# Patient Record
Sex: Male | Born: 1970 | Marital: Single | Smoking: Never smoker
Health system: Northeastern US, Community
[De-identification: ages and names within clinical notes are randomized; demographics above are authoritative.]

## PROBLEM LIST (undated history)

## (undated) DIAGNOSIS — R569 Unspecified convulsions: Secondary | ICD-10-CM

## (undated) HISTORY — DX: Unspecified convulsions: R56.9

## (undated) HISTORY — PX: NO SIGNIFICANT SURGICAL HISTORY: 1000005

---

## 2007-10-21 ENCOUNTER — Emergency Department (HOSPITAL_BASED_OUTPATIENT_CLINIC_OR_DEPARTMENT_OTHER)
Admission: RE | Admit: 2007-10-21 | Disposition: A | Discharge status: Home / Self Care | Payer: Self-pay | Source: Emergency Department | Attending: Emergency Medicine | Admitting: Emergency Medicine

## 2007-10-21 ENCOUNTER — Encounter (HOSPITAL_BASED_OUTPATIENT_CLINIC_OR_DEPARTMENT_OTHER): Payer: Self-pay

## 2007-10-21 MED ORDER — IBUPROFEN 600 MG PO TABS
ORAL_TABLET | ORAL | Status: AC
Start: 2007-10-21 — End: 2007-10-28
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
## 2007-10-23 ENCOUNTER — Ambulatory Visit (HOSPITAL_BASED_OUTPATIENT_CLINIC_OR_DEPARTMENT_OTHER): Payer: Self-pay | Admitting: Ent-Otolaryngology

## 2007-10-31 LAB — EMERGENCY ROOM NOTE

## 2008-06-13 ENCOUNTER — Encounter (HOSPITAL_BASED_OUTPATIENT_CLINIC_OR_DEPARTMENT_OTHER): Payer: Self-pay

## 2008-06-13 ENCOUNTER — Emergency Department (HOSPITAL_BASED_OUTPATIENT_CLINIC_OR_DEPARTMENT_OTHER)
Admission: RE | Admit: 2008-06-13 | Disposition: A | Discharge status: Home / Self Care | Payer: Self-pay | Source: Emergency Department | Attending: Emergency Medicine | Admitting: Emergency Medicine

## 2008-06-13 MED ORDER — IBUPROFEN 800 MG PO TABS
ORAL_TABLET | ORAL | Status: AC
Start: 2008-06-13 — End: 2008-07-13

## 2008-06-13 MED ORDER — CIPRO 500 MG PO TABS
ORAL_TABLET | ORAL | Status: AC
Start: 2008-06-13 — End: 2008-06-23

## 2008-06-13 NOTE — ED Notes (Signed)
Pt states he has testicular pain for 4 days, denies dysuria. Pt states he has diarrhea for 1 day. Pt denies abd pain.

## 2008-06-13 NOTE — Discharge Instructions (Signed)
Take medications as prescribed.     Return if not better in 2 days for recheck.

## 2008-06-17 LAB — EMERGENCY ROOM NOTE

## 2008-12-15 ENCOUNTER — Emergency Department (HOSPITAL_BASED_OUTPATIENT_CLINIC_OR_DEPARTMENT_OTHER)
Admission: RE | Admit: 2008-12-15 | Disposition: A | Discharge status: Home / Self Care | Payer: Self-pay | Source: Emergency Department

## 2008-12-15 ENCOUNTER — Encounter (HOSPITAL_BASED_OUTPATIENT_CLINIC_OR_DEPARTMENT_OTHER): Payer: Self-pay

## 2008-12-15 LAB — EMERGENCY ROOM NOTE

## 2008-12-15 NOTE — ED Notes (Signed)
Pt  C/o  Nausea for 15 days,    Also   Wants  STD  Testing,  Had intercourse 2 months ago  Unprotected  No symptoms of  Pain or discharge from penis.  No problems with urination.    Pt also requesting  Blood work done for  DM,  Chol  HIV  AIDs,  Per pt, " my blood in my  veins feels different"

## 2008-12-16 LAB — CHLAMYDIA GC NAAT
GENPROBE CHLAMYDIA: NEGATIVE
GENPROBE GC: NEGATIVE

## 2008-12-16 LAB — TREPONEMA PALLIDUM AB IGG: TREPONEMA PALLIDUM AB IgG: NONREACTIVE

## 2008-12-17 ENCOUNTER — Ambulatory Visit (HOSPITAL_BASED_OUTPATIENT_CLINIC_OR_DEPARTMENT_OTHER): Payer: PRIVATE HEALTH INSURANCE

## 2008-12-17 ENCOUNTER — Encounter (HOSPITAL_BASED_OUTPATIENT_CLINIC_OR_DEPARTMENT_OTHER): Payer: Self-pay

## 2008-12-17 DIAGNOSIS — Z717 Human immunodeficiency virus [HIV] counseling: Secondary | ICD-10-CM

## 2008-12-17 LAB — ICTR HIV POC RAPID
HIV 1 & 2 BY ORAQUICK: NEGATIVE
ONBOARD CONTROL PRESENT?: NEGATIVE

## 2008-12-17 NOTE — Progress Notes (Signed)
--  Subjective: History:    No chief complaint on file.        Current outpatient prescriptions:  IBUPROFEN 800 MG OR TABS Take 1 tab by mouth every 8 hours as needed for pain Disp: 20 Rfl: 0         Allergies: Review of patient's allergies indicates no known allergies.    Specific reason for testing: routine   -Disc:  Transmission (Blood, Semen, Vaginal Fluid and Breastmilk), Risky Behaviors, Non-Risks/Myths,  Seroconversion Period , Result Waiting Time, Possible Results, Feelings Surrounding Possible Results, Plans for Informing of Results, Support Structures and Hep C Risk.     Risk Assessment Completed:  y  Piercing/Tattoo: no  Known Partner Risks: y  IV Drug Use: no    Sexual History:    Current Partner:    # of partners (Last Year): 02    Sexual Partners: Both    Sexual Practices: Vaginal, Anal, Oral    Condom Use: Always    Condom Breakage:    Condom Instruction Given:    Risk Reduction Plans: y    --Objective:    HX of STDs: (dx/tx)  Tested Before: Yes:  HIV, Dates 10/2007    DV Assessment Completed: y    Hx of DV/IP abuse: n    Hx of Sexual Assault: n    Sexual Coercion: n    Last Poss. Exposure: 10/2008    Protection used: y    Seroconversion Period Explained:    Testing Options Chosen  Readiness to test:   Still want to test?   yes    Patient chose to be tested for: HIV    Test sent to: State lab    Type of test: Blood    --Assessment: HIV, HCV, and STI Education and Risk Reduction      --Plan: Education Counseling and Referrals      Consent for treatment  Confidentiality  Handouts given      Referrals:   Support Person Plan:   Primary Care:   Support Services:   Re-testing Scheduled:     Follow Up Plan:

## 2008-12-31 ENCOUNTER — Ambulatory Visit (HOSPITAL_BASED_OUTPATIENT_CLINIC_OR_DEPARTMENT_OTHER): Payer: PRIVATE HEALTH INSURANCE

## 2009-06-29 ENCOUNTER — Emergency Department (HOSPITAL_BASED_OUTPATIENT_CLINIC_OR_DEPARTMENT_OTHER)
Admission: RE | Admit: 2009-06-29 | Disposition: A | Discharge status: Home / Self Care | Payer: Self-pay | Source: Emergency Department | Attending: Emergency Medicine | Admitting: Emergency Medicine

## 2009-06-29 ENCOUNTER — Encounter (HOSPITAL_BASED_OUTPATIENT_CLINIC_OR_DEPARTMENT_OTHER): Payer: Self-pay

## 2009-06-29 LAB — EMERGENCY ROOM NOTE

## 2009-06-29 NOTE — ED Notes (Signed)
Injured  right  Hand yesterday  Abrasion on knuckles  Hand is swollen  No meds today    Pt is able to move fingers,  Circulation is good, positive

## 2009-06-29 NOTE — Discharge Instructions (Signed)
[place] [facility]    Gelo    [facility] -- 11:30 AM -- [employer] in [place]

## 2009-06-30 ENCOUNTER — Ambulatory Visit (HOSPITAL_BASED_OUTPATIENT_CLINIC_OR_DEPARTMENT_OTHER): Payer: Self-pay | Admitting: Hand Surgery

## 2009-06-30 ENCOUNTER — Encounter (HOSPITAL_BASED_OUTPATIENT_CLINIC_OR_DEPARTMENT_OTHER): Payer: Self-pay | Admitting: Hand Surgery

## 2009-06-30 DIAGNOSIS — S62309A Unspecified fracture of unspecified metacarpal bone, initial encounter for closed fracture: Secondary | ICD-10-CM

## 2009-06-30 NOTE — Progress Notes (Signed)
This office note has been dictated. Account number [id number]

## 2009-07-01 ENCOUNTER — Telehealth (HOSPITAL_BASED_OUTPATIENT_CLINIC_OR_DEPARTMENT_OTHER): Payer: Self-pay | Admitting: Hand Surgery

## 2009-07-01 ENCOUNTER — Ambulatory Visit (HOSPITAL_BASED_OUTPATIENT_CLINIC_OR_DEPARTMENT_OTHER): Payer: Self-pay | Admitting: Hand Surgery

## 2009-07-01 DIAGNOSIS — S62309A Unspecified fracture of unspecified metacarpal bone, initial encounter for closed fracture: Secondary | ICD-10-CM

## 2009-07-01 LAB — BLOOD SUGAR FINGERSTICK (POINT OF CARE): FINGERSTICK GLUCOSE: 89 mg/dl (ref 74–160)

## 2009-07-01 MED ORDER — VICODIN 5-500 MG PO TABS
ORAL_TABLET | ORAL | Status: DC
Start: 2009-07-01 — End: 2013-11-19

## 2009-07-01 MED ORDER — OXYCODONE-ACETAMINOPHEN 5-325 MG PO TABS
ORAL_TABLET | ORAL | Status: AC
Start: 2009-07-01 — End: 2009-08-25

## 2009-07-07 ENCOUNTER — Ambulatory Visit (HOSPITAL_BASED_OUTPATIENT_CLINIC_OR_DEPARTMENT_OTHER): Payer: Self-pay

## 2009-07-07 DIAGNOSIS — S62309A Unspecified fracture of unspecified metacarpal bone, initial encounter for closed fracture: Secondary | ICD-10-CM

## 2009-07-07 NOTE — Progress Notes (Signed)
[name] Kitchen  BASE EVALUATION - UPPER EXTREMITY    AGE: 39 year old  SEX: male  CHILDREN?: Yes  (2 children in [place])     RELATIONSHIP: Partnered ETHNICITY: [place] INTERPRETER?:  Yes  ([place] interpreter via phone)     LEISURE/HOBBIES: "meet with my friends socially, go for walks."     REFERRING PROVIDER: Debra A. Olam Idler, MD  [address]  [place], [place] [address]     DIAGNOSIS/REASON FOR REFERRAL: Percutaneous Pinning of right small finger metacarpal secondary to comminuted fracture base of right small finger metacarpal        Hx OF PRESENT ILLNESS: Patient reported on 06/28/09 he was at home standing on a chair and placing sheet rock up when he fell and landed on his right upper extremity.  Patient reported he rested his hand that night and the swelling and pain in his fingers increased into the next day, so he decided to go to the ER at [facility]. Xray showed comminuted base of the small finger metacarpal fracture and was placed in a splint.  Patient was seen in Orthopedics by Dr. Olam Idler on 06/30/09 and was indicated for surgery.  Patient underwent percutaneous pinning of right small finger metacarpal on 07/01/09 by Dr. Olam Idler. Patient arrived to appointment with post-operative dressing in place on right upper extremity: clean, dry and intact     MEDICAL Hx: See online medical chart in EPIC   SURGICAL Hx: Refer to online medical chart in EPIC   SOCIAL HISTORY/HOME SITUATION: Patient lives w/ friend(s).  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   Patient has been unable to work since this injury.   DOES PATIENT FEEL SAFE AT HOME: Yes LEARNS BEST: Verb.; demo   PAIN AT WORST: 2/10  "when I hang it down or if I try to move it."   PAIN AT BEST: 0/10 AVERAGE: 0/10   PAIN DESCRIPTION: Intermittent, stabbing pain   LOCATION OF PAIN (Use Images activity for visual diagram):  Ulnar side of right hand, over pins   SKIN INTEGRITY/COLOR: Patient reported noticing a slight rash volar surface of his right  elbow yesterday.  Upon removal of post-operative dressing and bathing of right forearm, volar and medial surfaces with slight rash.  Middle pin: slight pink at base; no tenderness upon palpation of skin at base of middle pin. Superior and inferior pins with no erythema/pink,  No drainage at base of all three pins.     SENSATION: WNL? No.  Patient reports feeling intermittent numbness on the dorsum of right hand     SUTURES: OTHER: None present MEASUREMENTS: N/A     DOMINANCE: right     EDEMA: Figure of 8: right 45.3cm; left 43.4cm       Please Note: Only populated fields were assessed by provider, fields left blank were not assessed.    GIRTH RIGHT LEFT GIRTH RIGHT LEFT GIRTH RIGHT LEFT   WRIST 16.8cm 16.6cm MP CIRCUMF. 22.6cm  20.9cm ELBOWS       SPECIAL TEST RIGHT LEFT SPECIAL TEST RIGHT LEFT SPECIAL TEST RIGHT LEFT   FINKELSTEINS   PHALENS   LONG FINGER     TINELS TEST             STRENGTH AND ROM:  RIGHT RIGHT LEFT LEFT COMMENTS    ROM MMT ROM MMT    SHOULD FLEX 0-180         EXT 0-50         ABDUCTION 0-160  INTERNAL ROTATION 0-70         EXTERNAL ROTATION 0-90        ELBOW FLEX/EXT 0-145        FOREAR PRONATION 0-85         SUPINATION 0-85        WRIST FLEX 0-70 0-30  WNL      EXT 0-70 0-45  WNL      ULNAR DEVIATION 0-30 0-20  WNL      RADIAL DEVIATION 0-20 WNL  WNL       ROM:  RIGHT RIGHT LEFT LEFT COMMENTS    ROM MMT AROM MMT    THUMB CM FLEX 0-15 WNL  WNL      CM EXT 0-20 WNL  WNL      MP FLEX/EXT 0-50 WNL  WNL      IP FLEX/EXT 0-80 WNL  WNL      ABDUCTION 0-70 WNL  WNL      OPPOSITION (CM) full  WNL     D2 MP FLEX 0-90 0-50  WNL      PIP FLEX/EXT 0-100 0-90  WNL      DIP FLEX/EXT 0-90 0-55  WNL     D3 MP FLEX 0-90 0-45  WNL      PIP FLEX/EXT 0-100 0-85  WNL      DIP FLEX/EXT 0-90 0-55  WNL     D4 MP FLEX 0-90 0-40  WNL      PIP FLEX/EXT 0-100 0-85  WNL      DIP FLEX/EXT 0-90 0-55  WNL     D5 MP FLEX 0-90 0-20  WNL      PIP FLEX/EXT 0-100 0-85  WNL      DIP FLEX/EXT 0-90 0-48  WNL     GRIP GRIP      Strength testing deferred at this time.   PINCH LATERAL         TRIPOD         TIP                   FUNCTIONAL STATUS  COMMENTS     FEEDING Impaired Patient reports cutting food with his left hand; if he uses his right hand, there is increased discomfort   PERSONAL CARE Impaired Increased time.  Compensates with right hand   DRESSING Impaired Increased time.  Compensates with right hand   LIFTING IMpaired Using left hand to lift items.  Patient reports he has not been shoveling snow since the injury   HOUSEHOLD CHORES Impaired Able to complete all ADL with increased time     Comments: Ulnar gutter splint custom fabricated at end of session to include wrist, RF and SF.  Home exercise program established and included right wrist AROM and tendon gliding exercises to be completed 8x/day, 10 reps.  Patient instructed in splint wear, care and precautions and verbalized good understanding.    Occupational Therapy Plan of Care    MD: Michela Pitcher, MD  Referring Provider: Michela Pitcher, MD  Diagnosis: CRPP 5th metacarpal right hand secondary to comminuted fracture of 5th metacarpal    Assessment/Objective Findings: Patient is a 39 year old male who is status post percutaneous pinning of the 5th metacarpal (07/01/09) of his right hand secondary to comminuted fracture at base of 5th metacarpal (06/29/09).  Patient presents with impaired skin integrity, edema, pain, decreased AROM and strength of right hand limiting his independence with self care, IADL and ability to work.  Patient will benefit from skilled OT to address the rehabilitation goals outlined below.    Rehabilitation Goals  Patient to be Independent with Home Exercise Program.  Duration: 2 weeks  Average pain will be reduced from a 2/10 to a 0/10. Duration: 6 weeks  AROM right small finger will increase to WNL. 6 weeks  Grip strength of right hand will increase to 20% greater than that of left hand. 6 weeks  Patient will verbalize and demonstrate excellent carryover  recommended splint wear and care. 6 weeks    Long Term Goal: "Get back to work."    Treatment Plan:   ** Stretching/ROM Exercise  ** Therapeutic Exercise  ** Home Exercise Program  ** Soft Tissue Mobilization  ** Ultrasound  ** Hot/Cold Rx  ** Functional Activities  ** Patient Education  ** Splinting  ** Pin Care    Recommend Occupational Therapy be continued 2 times per week for 6 weeks.  The rehabilitation potential for this patient is good    Patient is agreeable to treatment plan and is aware of attendance policy.    Bennie Dallas MS,OTR/L

## 2009-07-12 ENCOUNTER — Ambulatory Visit (HOSPITAL_BASED_OUTPATIENT_CLINIC_OR_DEPARTMENT_OTHER): Payer: Self-pay

## 2009-07-12 DIAGNOSIS — S62309A Unspecified fracture of unspecified metacarpal bone, initial encounter for closed fracture: Secondary | ICD-10-CM

## 2009-07-14 ENCOUNTER — Ambulatory Visit (HOSPITAL_BASED_OUTPATIENT_CLINIC_OR_DEPARTMENT_OTHER): Payer: Self-pay | Admitting: Rehabilitative and Restorative Service Providers"

## 2009-07-14 ENCOUNTER — Ambulatory Visit (HOSPITAL_BASED_OUTPATIENT_CLINIC_OR_DEPARTMENT_OTHER): Payer: Self-pay | Admitting: Hand Surgery

## 2009-07-14 DIAGNOSIS — S62309A Unspecified fracture of unspecified metacarpal bone, initial encounter for closed fracture: Secondary | ICD-10-CM

## 2009-07-14 MED ORDER — CEPHALEXIN 500 MG PO CAPS
500.0000 mg | ORAL_CAPSULE | Freq: Three times a day (TID) | ORAL | Status: AC
Start: 2009-07-14 — End: 2009-07-24

## 2009-07-14 NOTE — Progress Notes (Signed)
This office note has been dictated. Account number [id number]

## 2009-07-19 ENCOUNTER — Ambulatory Visit (HOSPITAL_BASED_OUTPATIENT_CLINIC_OR_DEPARTMENT_OTHER): Payer: Self-pay

## 2009-07-20 LAB — OPERATIVE REPORT

## 2009-07-26 ENCOUNTER — Ambulatory Visit (HOSPITAL_BASED_OUTPATIENT_CLINIC_OR_DEPARTMENT_OTHER): Payer: Self-pay

## 2009-07-28 ENCOUNTER — Ambulatory Visit (HOSPITAL_BASED_OUTPATIENT_CLINIC_OR_DEPARTMENT_OTHER): Payer: Self-pay

## 2009-08-02 ENCOUNTER — Ambulatory Visit (HOSPITAL_BASED_OUTPATIENT_CLINIC_OR_DEPARTMENT_OTHER): Payer: Self-pay

## 2009-08-02 NOTE — Patient Instructions (Addendum)
Rehabilitation Treatment Flowsheet    Precautions: None noted    Date: 07/07/09         Initials: LVC         Visit #: 1         POC Due Date: 08/18/09         Time:         HEP Splint; keep right hand dry' TGE's         Treatment(s)            Post-operative dressing removal right UE x           Evaluation x           Splinting UG splint custom fabricated; patient instructed to wear splint at all times except ex.           Pin care x           Joint blocking right SF            Tendon gliding exercise right hand

## 2009-08-02 NOTE — Progress Notes (Signed)
07/12/09  S: "I called and my insurance said today was all right."  O: Pins: no erythema; no drainage; 3 pins cleaned and dressed with xeroform and dry/sterile dressings.  AROM: straight fist WNL; table top WNL; right wrist extension 47 degrees; right wrist flexion 68 degrees; tip of right MF to [facility] is 2cm.  A:  Patient demonstrating excellent carryover of splint wear and HEP (AROM wrist, TGE) established at time of initial evaluation.  P:  Continue with OT per current plan of care.

## 2009-08-02 NOTE — Patient Instructions (Signed)
Rehabilitation Treatment Flowsheet    Precautions: None noted    Date: 07/07/09 07/12/09        Initials: LVC LVC        Visit #: 1 2        POC Due Date: 08/18/09         Time:         HEP Splint; keep right hand dry, TGE's Cont.        Treatment(s)            Post-operative dressing removal right UE x           Evaluation x           Splinting UG splint custom fabricated; patient instructed to wear splint at all times except ex. Cont.          Pin care x x          Joint blocking right SF  x          Tendon gliding exercises right hand  x

## 2009-08-04 ENCOUNTER — Ambulatory Visit (HOSPITAL_BASED_OUTPATIENT_CLINIC_OR_DEPARTMENT_OTHER): Payer: PRIVATE HEALTH INSURANCE | Admitting: Hand Surgery

## 2009-08-04 ENCOUNTER — Ambulatory Visit (HOSPITAL_BASED_OUTPATIENT_CLINIC_OR_DEPARTMENT_OTHER): Payer: PRIVATE HEALTH INSURANCE

## 2009-08-04 DIAGNOSIS — S62309A Unspecified fracture of unspecified metacarpal bone, initial encounter for closed fracture: Secondary | ICD-10-CM

## 2009-08-04 NOTE — Progress Notes (Signed)
This office note has been dictated. Account number [id number]

## 2009-08-09 ENCOUNTER — Ambulatory Visit (HOSPITAL_BASED_OUTPATIENT_CLINIC_OR_DEPARTMENT_OTHER): Payer: PRIVATE HEALTH INSURANCE

## 2009-08-11 ENCOUNTER — Ambulatory Visit (HOSPITAL_BASED_OUTPATIENT_CLINIC_OR_DEPARTMENT_OTHER): Payer: PRIVATE HEALTH INSURANCE

## 2009-08-15 LAB — ORTHOPEDIC OFFICE NOTE

## 2009-08-21 ENCOUNTER — Encounter (HOSPITAL_BASED_OUTPATIENT_CLINIC_OR_DEPARTMENT_OTHER): Payer: Self-pay

## 2009-08-21 ENCOUNTER — Emergency Department (HOSPITAL_BASED_OUTPATIENT_CLINIC_OR_DEPARTMENT_OTHER): Admission: RE | Admit: 2009-08-21 | Payer: Self-pay

## 2009-08-21 ENCOUNTER — Emergency Department (HOSPITAL_BASED_OUTPATIENT_CLINIC_OR_DEPARTMENT_OTHER)
Admission: RE | Admit: 2009-08-21 | Disposition: A | Discharge status: Home / Self Care | Payer: Self-pay | Source: Emergency Department | Attending: Emergency Medicine | Admitting: Emergency Medicine

## 2009-08-21 MED ORDER — IBUPROFEN 800 MG PO TABS
800.0000 mg | ORAL_TABLET | Freq: Three times a day (TID) | ORAL | Status: AC | PRN
Start: 2009-08-21 — End: 2009-09-20

## 2009-08-21 MED ORDER — CYCLOBENZAPRINE HCL 10 MG PO TABS
10.00 mg | ORAL_TABLET | Freq: Three times a day (TID) | ORAL | Status: AC | PRN
Start: 2009-08-21 — End: 2009-09-20

## 2009-08-21 MED ORDER — OXYCODONE-ACETAMINOPHEN 5-325 MG PO TABS
1.0000 | ORAL_TABLET | ORAL | Status: DC
Start: 2009-08-21 — End: 2013-11-19

## 2009-08-21 NOTE — Discharge Instructions (Signed)
Avoid activities that worsen your discomfort.  You may apply ice packs to your painful area intermittently for 2 days, then use warm compresses.  Take your medications as prescribed; they may cause sedation.  Contact the [facility] for on going medical care.  Return if your symptoms worsen or new symptoms develop.

## 2009-08-21 NOTE — ED Notes (Signed)
Pt arrives ambulatory with CC of low back pain - worse on right side x's 7-8 days.   Denies any trauma.  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

## 2009-08-22 LAB — EMERGENCY ROOM NOTE

## 2009-08-24 NOTE — Patient Instructions (Signed)
Rehabilitation Treatment Flowsheet    Precautions: None noted    Date: 07/07/09 07/12/09 07/14/09       Initials: LVC LVC DB       Visit #: 1 2 3        POC Due Date: 08/18/09         Time:  30 min       HEP Splint; keep right hand dry, TGE's Cont.        Treatment(s)            Post-operative dressing removal right UE x           Evaluation x           Splinting UG splint custom fabricated; patient instructed to wear splint at all times except ex. Cont. cont         Pin care x x x         Joint blocking right SF  x x         Tendon gliding exercises right hand  x x

## 2009-08-24 NOTE — Progress Notes (Signed)
07/14/09  S: "I am doing better."  O: See Flowsheet.  A: Pins clean and dry. Patient progressing toward goals.  P: Continue with OT per POC.    07/12/09  S: "I called and my insurance said today was all right."  O: Pins: no erythema; no drainage; 3 pins cleaned and dressed with xeroform and dry/sterile dressings.  AROM: straight fist WNL; table top WNL; right wrist extension 47 degrees; right wrist flexion 68 degrees; tip of right MF to [facility] is 2cm.  A:  Patient demonstrating excellent carryover of splint wear and HEP (AROM wrist, TGE) established at time of initial evaluation.  P:  Continue with OT per current plan of care.

## 2009-08-25 NOTE — Patient Instructions (Addendum)
Rehabilitation Treatment Flowsheet    Precautions: None noted    Date: 07/07/09 07/12/09 07/14/09 08/04/09      Initials: LVC LVC DB LVC      Visit #: 1 2 3 4       POC Due Date: 08/18/09         Time:  30 min      HEP Splint; keep right hand dry, TGE's Cont.        Treatment(s)            Post-operative dressing removal right UE x           Evaluation x           Splinting UG splint custom fabricated; patient instructed to wear splint at all times except ex. Cont. cont New splint fabricated        Pin care x x x Pins removed at appt. today        Joint blocking right SF  x x WNL        Tendon gliding exercises right hand  x x WNL

## 2009-08-25 NOTE — Progress Notes (Signed)
07/14/09  S: "I am doing better."  O: See Flowsheet.  A: Pins clean and dry. Patient progressing toward goals.  P: Continue with OT per POC.    07/12/09  S: "I called and my insurance said today was all right."  O: Pins: no erythema; no drainage; 3 pins cleaned and dressed with xeroform and dry/sterile dressings.  AROM: straight fist WNL; table top WNL; right wrist extension 47 degrees; right wrist flexion 68 degrees; tip of right MF to [facility] is 2cm.  A:  Patient demonstrating excellent carryover of splint wear and HEP (AROM wrist, TGE) established at time of initial evaluation.  P:  Continue with OT per current plan of care.    08/04/09  S: "I saw Dr. Olam Idler today and she said I am recovering well."  O: Skin: pins removed; no erythema; no drainage.  AROM: WNL all joints in all digits; full tendon excursion.  Splint: new splint fabricated for right UE, as current splint is too big now that pins are removed.  Patient reports he has been working, with the splint donned at all times, and has not been using his R UE to lift anything.  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    A:  Patient has demonstrated excellent gains since last visit.    P:  Continue with OT per current plan of care.

## 2009-09-01 ENCOUNTER — Ambulatory Visit (HOSPITAL_BASED_OUTPATIENT_CLINIC_OR_DEPARTMENT_OTHER): Payer: PRIVATE HEALTH INSURANCE

## 2009-09-02 LAB — ORTHOPEDIC OFFICE NOTE

## 2009-09-06 LAB — EMERGENCY ROOM NOTE

## 2010-03-13 ENCOUNTER — Emergency Department (HOSPITAL_BASED_OUTPATIENT_CLINIC_OR_DEPARTMENT_OTHER)
Admission: RE | Admit: 2010-03-13 | Disposition: A | Discharge status: Home / Self Care | Payer: Self-pay | Source: Emergency Department | Attending: Emergency Medicine | Admitting: Emergency Medicine

## 2010-03-13 ENCOUNTER — Encounter (HOSPITAL_BASED_OUTPATIENT_CLINIC_OR_DEPARTMENT_OTHER): Payer: Self-pay | Admitting: Geri Psychiatry (PRV Practice 16)

## 2010-03-13 MED ORDER — HYDROXYZINE HCL 25 MG PO TABS
25.00 mg | ORAL_TABLET | Freq: Four times a day (QID) | ORAL | Status: AC | PRN
Start: 2010-03-13 — End: 2010-03-23

## 2010-03-13 MED ORDER — HYDROXYZINE HCL 25 MG PO TABS
25.0000 mg | ORAL_TABLET | Freq: Once | ORAL | Status: DC
Start: 2010-03-13 — End: 2010-03-13
  Filled 2010-03-13: qty 1

## 2010-03-13 MED ORDER — PREDNISONE 10 MG PO TABS
ORAL_TABLET | ORAL | Status: AC
Start: 2010-03-14 — End: 2010-03-24

## 2010-03-13 MED ORDER — PREDNISONE 20 MG PO TABS
60.0000 mg | ORAL_TABLET | Freq: Once | ORAL | Status: DC
Start: 2010-03-13 — End: 2010-03-13
  Filled 2010-03-13: qty 3

## 2010-03-13 NOTE — Discharge Instructions (Signed)
Take medications as prescribed.     Follow up if not better next week.     If you need or would like a PCP, please call [facility] (located in the Medical Building attached to the hospital on [address]) at [phone] for appointment.

## 2010-03-13 NOTE — ED Notes (Signed)
39 y/o male presents with with swelling and rash on cheeks for 1 week.  Prior to this he complained of itchy eyes 3 weeks ago which resolved.

## 2010-03-15 LAB — EMERGENCY ROOM NOTE

## 2010-03-19 ENCOUNTER — Emergency Department (HOSPITAL_BASED_OUTPATIENT_CLINIC_OR_DEPARTMENT_OTHER)
Admission: RE | Admit: 2010-03-19 | Disposition: A | Discharge status: Home / Self Care | Payer: Self-pay | Source: Emergency Department | Attending: Emergency Medicine | Admitting: Emergency Medicine

## 2010-03-19 MED ORDER — IBUPROFEN 600 MG PO TABS
600.0000 mg | ORAL_TABLET | Freq: Three times a day (TID) | ORAL | Status: DC | PRN
Start: 2010-03-19 — End: 2013-11-16

## 2010-03-19 MED ORDER — IBUPROFEN 600 MG PO TABS
ORAL_TABLET | ORAL | Status: DC
Start: 2010-03-19 — End: 2010-03-19
  Filled 2010-03-19: qty 1

## 2010-03-19 MED ORDER — IBUPROFEN 600 MG PO TABS
600.00 mg | ORAL_TABLET | Freq: Once | ORAL | Status: AC
Start: 2010-03-19 — End: 2010-03-19
  Administered 2010-03-19: 600 mg via ORAL

## 2010-03-19 NOTE — Discharge Instructions (Signed)
Motrin as needed follow up pcp if symptoms do not improve or other problems developDor Msculo-Esqueltica  (Musculo-Skeletal Pain)     Voc procurou o mdico por sentir dores musculares e sseas. Elas podem aparecer em qualquer parte do corpo e freqentemente no h causa definida nem uma razo que justifique sua presena. Se os resultados de exames de laboratrio (sangue ou urina) foram normais, assim como raios X ou outros estudos, o mdico pode trat-lo sem conhecer a Engineering geologist. Estas dores podem ser causadas por um vrus. O incmodo tambm pode vir de esforo excessivo, como praticar ginstica rdua quando seu corpo no est em forma para enfrentar as [employer]. As dores sseas tambm so causadas por mudanas no tempo, uma vez que os ossos so sensveis a mudanas na presso atmosfrica.      Se voc sofre dores sem motivo aparente,  importante seguir as instrues do mdico. Se a dor vier a piorar ou no ceder, pode ser necessrio repetir exames ou fazer exames adicionais e aprofundar na busca de uma causa possvel.     INSTRUES PARA TRATAMENTO DOMICILIAR   S tome no balcao ou medicinas de receita para dor, incmodo, ou febre como dirigido por seu mdico.   Para manter sua privacidade, os [employer] testes no podem ser informados por telefone. No deixe de obter os resultados de [employer]. Pergunte como proceder para conseguir [employer] se voc no tiver sido informado.  sua responsabilidade obter os resultados.   Prossiga suas atividades normais, a menos que causem [employer]. Quando a dor diminui,  essencial voltar lentamente s atividades normais. Volte s atividades ou exerccios lentamente no comeo e v gradualmente incrementando a intensidade e durao. Durante perodos de dor aguda,  aconselhvel repousar na cama. Deite ou sente-se em qualquer posio que traga conforto.    Usar gelo para condies penetrantes (repentinas) pode ser eficiente. Use um saco plstico  grande contendo o gelo, envolta em uma [place]. Isto pode aliviar a dor.   Procure o mdico se o problema continuar. Ele pode ajudar ou indicar exerccios ou fisioterapia, se necessrio.      Se tomar a medicao receitada para sua condio, no dirija, opere maquinrio ou ferramentas de alta potncia, nem assine documentos legais durante 24 horas. No tome lcool, soporficos ou outros medicamentos que possam afetar o tratamento.      PROCURE ASSISTNCIA MDICA IMEDIATAMENTE:   Se voc sentir dor que est piorando e no  aliviada com remdios.   Se voc sentir dor no peito associada com falta de ar, suor, nusea ou vmito.   Se sua dor estiver localizada no abdmen.   Se voc apresentar quaisquer novos sintomas que paream diferentes ou que o preocupem.     CERTIFIQUE-SE DE:   Compreende as instrues referentes  alta.    Ir monitorar sua condio.   Procurar assistncia mdica imediatamente, conforme indicado.     Document Released: 05/07/2005  Document Re-Released: 09/23/2008  [facility] Patient Information 2010 [place], [place].

## 2010-03-19 NOTE — ED Notes (Signed)
Pt c/o bilateral leg pain since this am. No known trauma. Also states he was bitten by an insect 2 days ago.

## 2010-03-22 LAB — EMERGENCY ROOM NOTE

## 2010-05-18 ENCOUNTER — Encounter (HOSPITAL_BASED_OUTPATIENT_CLINIC_OR_DEPARTMENT_OTHER): Payer: PRIVATE HEALTH INSURANCE

## 2010-05-18 ENCOUNTER — Encounter (HOSPITAL_BASED_OUTPATIENT_CLINIC_OR_DEPARTMENT_OTHER): Payer: Self-pay

## 2010-06-18 ENCOUNTER — Encounter (HOSPITAL_BASED_OUTPATIENT_CLINIC_OR_DEPARTMENT_OTHER): Payer: Self-pay

## 2010-06-18 ENCOUNTER — Emergency Department (HOSPITAL_BASED_OUTPATIENT_CLINIC_OR_DEPARTMENT_OTHER)
Admission: RE | Admit: 2010-06-18 | Disposition: A | Discharge status: Home / Self Care | Payer: Self-pay | Source: Emergency Department

## 2010-06-18 LAB — CBC WITH PLATELET
HEMATOCRIT: 43.8 % (ref 42.0–54.0)
HEMOGLOBIN: 14.9 g/dl (ref 14.0–18.0)
MEAN CORP HGB CONC: 34.1 g/dl (ref 32.0–36.0)
MEAN CORPUSCULAR HGB: 28.6 pg (ref 27.0–33.0)
MEAN CORPUSCULAR VOL: 84.1 fl (ref 80.0–100.0)
MEAN PLATELET VOLUME: 9.7 fl (ref 6.4–10.8)
PLATELET COUNT: 232 10*3/uL (ref 150–400)
RBC DISTRIBUTION WIDTH: 12.1 % (ref 11.5–14.3)
RED BLOOD CELL COUNT: 5.21 M/uL (ref 4.50–6.10)
WHITE BLOOD CELL COUNT: 11.5 10*3/uL — ABNORMAL HIGH (ref 4.0–10.8)

## 2010-06-18 MED ORDER — LIDOCAINE VISCOUS 2 % MT SOLN
5.00 mL | Freq: Once | OROMUCOSAL | Status: AC
Start: 2010-06-18 — End: 2010-06-18
  Administered 2010-06-18: 5 mL via OROMUCOSAL
  Filled 2010-06-18: qty 20

## 2010-06-18 MED ORDER — FAMOTIDINE 20 MG PO TABS
20.00 mg | ORAL_TABLET | Freq: Once | ORAL | Status: AC
Start: 2010-06-18 — End: 2010-06-18
  Administered 2010-06-18: 20 mg via ORAL
  Filled 2010-06-18: qty 1

## 2010-06-18 MED ORDER — ALUMINUM & MAGNESIUM HYDROXIDE 200-200 MG/5ML PO SUSP
30.00 mL | Freq: Once | ORAL | Status: AC
Start: 2010-06-18 — End: 2010-06-18
  Administered 2010-06-18: 30 mL via ORAL
  Filled 2010-06-18: qty 30

## 2010-06-18 NOTE — ED Notes (Signed)
Pt c/o upper abd pain x 3 hours.

## 2010-06-19 LAB — BASIC METABOLIC PANEL
ANION GAP: 9 mmol/L (ref 2–25)
BUN (UREA NITROGEN): 14 mg/dl (ref 6–20)
CALCIUM: 9 mg/dl (ref 8.6–10.3)
CARBON DIOXIDE: 26 mmol/L (ref 22–32)
CHLORIDE: 105 mmol/L (ref 101–111)
CREATININE: 0.8 mg/dl (ref 0.7–1.2)
ESTIMATED GLOMERULAR FILT RATE: >60 mL/min (ref >60)
Glucose Random: 112 mg/dl (ref 74–160)
POTASSIUM: 3.9 mmol/L (ref 3.5–5.1)
SODIUM: 140 mmol/L (ref 135–144)

## 2010-06-19 LAB — CHG HEPATIC FUNCTION PANEL
ALANINE AMINOTRANSFERASE: 83 IU/L — ABNORMAL HIGH (ref 10–40)
ALBUMIN: 4.1 g/dl (ref 3.4–4.8)
ALKALINE PHOSPHATASE: 51 IU/L (ref 25–106)
ASPARTATE AMINOTRANSFERASE: 193 IU/L — ABNORMAL HIGH (ref 8–34)
BILIRUBIN DIRECT: 0.1 mg/dl (ref 0.0–0.2)
BILIRUBIN TOTAL: 0.4 mg/dl (ref 0.2–1.1)
INDIRECT BILIRUBIN: 0.3 mg/dl (ref 0.2–0.9)
TOTAL PROTEIN: 6.4 g/dl (ref 5.9–7.5)

## 2010-06-19 LAB — LIPASE: LIPASE: 23 U/L (ref 10–50)

## 2010-06-19 LAB — HOLD RED TOP TUBE

## 2010-06-19 MED ORDER — FAMOTIDINE 20 MG PO TABS
20.0000 mg | ORAL_TABLET | Freq: Two times a day (BID) | ORAL | Status: DC
Start: 2010-06-19 — End: 2010-06-29

## 2010-06-19 NOTE — Discharge Instructions (Signed)
Your blood tests were normal.    We suspect your pain was due to stomach irritation from alcohol use.    We have prescribed a medication for stomach acid.    Please follow up with your doctor this week!

## 2010-06-22 LAB — EMERGENCY ROOM NOTE

## 2010-06-29 ENCOUNTER — Ambulatory Visit (HOSPITAL_BASED_OUTPATIENT_CLINIC_OR_DEPARTMENT_OTHER): Payer: PRIVATE HEALTH INSURANCE

## 2010-06-29 ENCOUNTER — Encounter (HOSPITAL_BASED_OUTPATIENT_CLINIC_OR_DEPARTMENT_OTHER): Payer: Self-pay

## 2010-06-29 VITALS — BP 102/70 | HR 76 | Temp 97.5°F | Wt 134.0 lb

## 2010-06-29 DIAGNOSIS — Z0000 Encounter for general adult medical examination without abnormal findings: Secondary | ICD-10-CM

## 2010-06-29 DIAGNOSIS — F1010 Alcohol abuse, uncomplicated: Secondary | ICD-10-CM

## 2010-06-29 DIAGNOSIS — K3189 Other diseases of stomach and duodenum: Secondary | ICD-10-CM

## 2010-06-29 DIAGNOSIS — R1013 Epigastric pain: Secondary | ICD-10-CM

## 2010-06-29 MED ORDER — FAMOTIDINE 20 MG PO TABS
20.0000 mg | ORAL_TABLET | Freq: Two times a day (BID) | ORAL | Status: AC
Start: 2010-06-29 — End: 2010-07-29

## 2010-06-29 NOTE — Progress Notes (Signed)
CC: Joshua Holt is a 40 year old patient who comes for: ER F/U    HPI:    1. Establish care: Moved to the [place] in 2006, reports being generally healthy since then except an episode of abdominal pain that occurred this last month.  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  He reports recently being tested for STDs including HIV, all of which were negative. At this same time, he received the Flu vaccine and a tetanus booster. He reports that this was part of a church organized health fair two months ago.  He also has results in EPIC from July that indicate negative results for GC/Chlamydia and HIV.  He denies ever having Lipid screening, Diabetes screening, or any cancer screening.  He denies any pertinent family history of DM, heart disease, or cancer.    2. Stomach pain: First noticed on January 29th of this year, Joshua Holt describes strong epigastric pain that moved in a circular motion, twisting/cramping in nature graded 10/10 with no radiation.  The pain was constant, and not worsened by motion or food.  He reports an episode of vomiting associated with the pain, but no changes in his bowels.  He visited the ED and was prescribed famotidine, after which the pain went away. He states that he was told the pain was due to alcohol, but he reports he had 4 beers the following day with no pain.  Luverne reports that the pain has not returned since starting the medication.    3. Alcohol abuse: Joshua Holt reports drinking a good deal of alcohol, but does not identify it as a problem. He reports 10-20 beers/day on weekends and 3-4 beers a night during the week, but notes that he does not drink every day during the week. He states that he also drank while in [place] with his wife and his family, but he would not drink as much on weekends.  He states that during his visit to the ED, the physician indicated that  his stomach pain may be the result of his drinking, and that doctor suggested he cut back, which Joshua Holt reports having done. During that past two weeks, he reports having 3 beers a night during the week and weekends, but not drinking every night. He states that his father and two brothers also drink a lot, but again, he does not recognize their behavior as a problem with health or social consequences.  He reports that he had increased the amount he drinks since moving to the [place], and being away from his family.     Health maintenance update:   LIPID SCREENING discussed at this visit  PHYSICAL EXAM (AGE 8-39) completed on 06/29/2010  TETANUS (16 AND OVER) completed on 04/24/2010  HIV SCREENING Completed  Health counseling reviewed:  Smoking, alcohol and drugs: Discussed alcohol use and need to cut down  Healthy eating: fruit/vegetable, low-fat dairy, whole grain, staying away from added sugars, salt, saturated fat  Physical activity, weight are both stable  Safe sex, STD screen recently performed. 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  MEDS:   Famotidine 20 mg PO twice daily    SH and FH: as noted in EPIC     Smoking status: Never Smoker     Smokeless tobacco:     Alcohol Use: Yes  1.5 oz/week    3 drink(s) per week         Comment: Drank 10-20 drinks/day (beer) during weekends for 5 years; Cut back recently due to stomach pain     Social History Narrative    Born in [place]; moved to the [place] in 2006 for financial reasons.    No family in [place]; remain in [place].    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 All 4 live in [place]    Describes difficulty living so far away from family. He misses his family greatly, and his relationship with his wife has changed a lot. They have stayed in touch for the children, but he has not seen her in 5 years.    History reviewed.  No pertinent family history.  ROS: Pt denies unusual headaches, vision changes, chest pain, dyspnea, dysphagia, changes in bowel, or urinary habits, loss of consciousness, paresthesia/paralysis, arthralgias/myalgias or mood changes. No fever, chills or night sweat. Stable appetite, weight and energy level  Reports difficulty sleeping recently.  Reports normal mood.  PE: Vitals: BP 102/70   Pulse 76   Temp(Src) 97.5 F (36.4 C) (Oral)   Wt 134 lb (60.782 kg)   SpO2 98%    General appearance: WD/WN in NAD  HEENT : NC/AT, sclerae anicteric, PERRLA, TM clear x 2, OP clear, nl dentition, fundi nl x 2  Neck: supple w/o masses, trachea midline, no LAD, no thyromegaly, no JVD, carotids 2+ w/o thrill or bruit  Breasts: no masses, no nipple discharge, no axillary adenopathy, nl skin  Lungs: CTAB, no w/r/r  Cardiac: RRR, normal S1/S2, no m/r/g  Abdomen: S/NT/ND/NABS, no rebound tenderness/guarding, no HSM. GU: no CVAT  Male GU: no penile lesions or discharge, testicles equal w/o masses, no hernia  Male GU: no lesions or discharge, nl cervix, no CMT, nl ovaries and uterus  Extremities: no c/c/e, pulses 2+ throughout bilaterally  Joint: no swelling/tenderness/redness/warmth, nl ROM  LN: no cervical/supraclavicular/axillary/inguinal LAD  Neuro: A&Ox3. CN : II-XII nl. Motor: power 5/5. Reflexes: 2+ throughout and equal bilaterally, no babinski. Sensory:  grossly intact to light touch, negative Romberg. Coordination: nl rapid alternating mvt, no dysarthria or dysmetria. Gait: nl.  Psych: affect euthymic, mood "normal", thoughts logical/sequential, no hallucinations, no  suicidal ideation.  DATA:   Most Recent Weight Reading(s)  06/29/10 : 134 lb (60.782 kg)  03/19/10 : 130 lb 1.1 oz (59 kg)  03/13/10 : 127 lb 13.9 oz (58 kg)    Most Recent BP Reading(s)  06/29/10 : 102/70  06/19/10 : 115/67  03/19/10 : 119/79  03/13/10 : 132/89    ASSESSMENT & PLAN:  V70.0 Routine general medical examination at a health care facility  (primary encounter diagnosis)  Comment: Generally healthy with no significant health risk factors aside from excessive alcohol consumption (see below).  Routine screening suggested today including Lipids and A1C. Carlos agreed with plan to  come back on the following morning to have labs drawn.  I will follow-up regarding any abnormal values, and would like to see Joshua Holt in 3-4 months.  Plan:   1. LIPID PANEL,   2. HEMOGLOBIN A1C        ROUTINE VENIPUNCTURE    536.8 Dyspepsia and other specified disorders of function of stomach  Comment: Discharged from [facility] ED with famotidine for alcohol related gastritis. He reports no repeat flares of pain, bloody emesis, or dark stools. I counseled him on the effects alcohol can have on the stomach and liver, and suggested he cut down on the amount of alcohol consumption.  I will continue the famotidine and gave him refills.  Plan:   1. Continue Famotidine  2. Decrease alcohol consumption    305.00A Alcohol abuse  Comment: History of alcohol abuse in [place] that increased when came to [place]. Joshua Holt does not identify his drinking as a problem responding positively to 1/4 CAGE questions. With positive family history of alcohol abuse and current binge drinking behavior, I educated Joshua Holt on what is considered the "safe" level of alcohol consumption: 14 or less drinks/week (12oz beers) and no more than 4 drinks at one sitting.  I counseled Joshua Holt on the negative health effects of excessive alcohol consumption, and he agreed to cut back to 2 or less beers a night.  He stated that he was confident that he could accomplish this goal without  other support; I asked that he call the office if he found it difficult to do so on his own.  I will follow-up in a week to see how he is doing.  Plan:   1. Counseled on cutting back the amount of alcohol; asked to call if having difficulty  2. HEPATIC FUNCTION PANEL      follow-up will be scheduled for 3.4 months from now  he has been advised to call or return with any worsening or new problems

## 2010-06-29 NOTE — Patient Instructions (Signed)
Please return tomorrow morning for blood tests and bring your lab papers with you.    No food after midnight tonight for your blood test.  You will be called within 3 days if there abnormal values on the lab tests, otherwise you will receive a letter in the mail.    If you have any questions or concerns, please call the office.  Please leave a phone number and a good time to call you back.    Por favor, retorne amanh de manh para exames de sangue e trazer seus trabalhos de laboratrio com voc. Sem comida depois da meia noite para o seu exame de sangue. Voc vai ser [employer] de 3 dias se no houver valores anormais nos testes de laboratrio, caso contrrio, voc receber uma carta no correio. Se voc tiver dvidas ou perguntas, por favor ligue para o escritrio. Por favor, deixe um nmero de telefone e um bom momento para cham-lo de volta.

## 2010-06-30 ENCOUNTER — Other Ambulatory Visit (HOSPITAL_BASED_OUTPATIENT_CLINIC_OR_DEPARTMENT_OTHER): Payer: PRIVATE HEALTH INSURANCE | Admitting: Lab

## 2010-06-30 ENCOUNTER — Encounter (HOSPITAL_BASED_OUTPATIENT_CLINIC_OR_DEPARTMENT_OTHER): Payer: Self-pay

## 2010-06-30 DIAGNOSIS — R1013 Epigastric pain: Secondary | ICD-10-CM

## 2010-06-30 DIAGNOSIS — Z0000 Encounter for general adult medical examination without abnormal findings: Secondary | ICD-10-CM

## 2010-06-30 DIAGNOSIS — F1010 Alcohol abuse, uncomplicated: Secondary | ICD-10-CM | POA: Insufficient documentation

## 2010-06-30 DIAGNOSIS — K3189 Other diseases of stomach and duodenum: Secondary | ICD-10-CM | POA: Insufficient documentation

## 2010-06-30 NOTE — Progress Notes (Signed)
.    Labs drawn. Av

## 2010-07-03 LAB — HEMOGLOBIN A1C
ESTIMATED AVERAGE GLUCOSE: 94 (ref 74–160)
HEMOGLOBIN A1C: 4.9 % (ref 0–6.0)

## 2010-07-03 LAB — CHG HEPATIC FUNCTION PANEL
ALANINE AMINOTRANSFERASE: 30 IU/L (ref 10–40)
ALBUMIN: 4.3 g/dl (ref 3.4–4.8)
ALKALINE PHOSPHATASE: 58 IU/L (ref 25–106)
ASPARTATE AMINOTRANSFERASE: 27 IU/L (ref 8–34)
BILIRUBIN DIRECT: 0.2 mg/dl (ref 0.0–0.2)
BILIRUBIN TOTAL: 0.8 mg/dl (ref 0.2–1.1)
INDIRECT BILIRUBIN: 0.6 mg/dl (ref 0.2–0.9)
TOTAL PROTEIN: 6.7 g/dl (ref 5.9–7.5)

## 2010-07-03 LAB — CHG LIPID PANEL
Cholesterol: 195 mg/dl (ref 0–200)
HIGH DENSITY LIPOPROTEIN: 60 mg/dl (ref 29–71)
LOW DENSITY LIPOPROTEIN DIRECT: 111 mg/dl — ABNORMAL HIGH (ref 0–100)
RISK FACTOR: 3.3 (ref ?–5.0)
TRIGLYCERIDES: 47 mg/dl (ref 0–150)

## 2010-07-06 NOTE — Progress Notes (Signed)
PRECEPTOR NOTE  On the day of the patient's visit, I personally saw and evaluated the patient. In addition, I reviewed findings with the resident. I confirm the key elements of history and physical exam as described in resident's note.  I agree with the assessment and plan as described below.  Please see resident's note for further details.

## 2011-03-21 ENCOUNTER — Emergency Department (HOSPITAL_BASED_OUTPATIENT_CLINIC_OR_DEPARTMENT_OTHER)
Admission: RE | Admit: 2011-03-21 | Disposition: A | Discharge status: Home / Self Care | Payer: Self-pay | Source: Emergency Department | Attending: Emergency Medicine | Admitting: Emergency Medicine

## 2011-03-21 ENCOUNTER — Encounter (HOSPITAL_BASED_OUTPATIENT_CLINIC_OR_DEPARTMENT_OTHER): Payer: Self-pay | Admitting: Emergency Medicine

## 2011-03-21 LAB — URINE DIP (POINT OF CARE)
BILIRUBIN, URINE: NEGATIVE
GLUCOSE, URINE: NEGATIVE mg/dl
KETONE, URINE: NEGATIVE mg/dl
NITRITE, URINE: NEGATIVE
OCCULT BLOOD, URINE: NEGATIVE
PH URINE: 6 (ref 5.0–8.0)
PROTEIN, URINE: NEGATIVE mg/dl (ref 0–15)
SPECIFIC GRAVITY URINE: 1.025 (ref 1.003–1.030)
UROBILINOGEN URINE: 0.2 mg/dl (ref 0.2–1.0)

## 2011-03-21 MED ORDER — CIPROFLOXACIN HCL 500 MG PO TABS
500.00 mg | ORAL_TABLET | Freq: Once | ORAL | Status: AC
Start: 2011-03-21 — End: 2011-03-21
  Administered 2011-03-21: 500 mg via ORAL
  Filled 2011-03-21: qty 1

## 2011-03-21 MED ORDER — LIDOCAINE HCL (PF) 1 % IJ SOLN
2.00 mL | Freq: Once | INTRAMUSCULAR | Status: AC
Start: 2011-03-21 — End: 2011-03-21
  Administered 2011-03-21: 20 mg via SUBCUTANEOUS
  Filled 2011-03-21: qty 2

## 2011-03-21 MED ORDER — CIPROFLOXACIN HCL 500 MG PO TABS
500.00 mg | ORAL_TABLET | Freq: Two times a day (BID) | ORAL | Status: AC
Start: 2011-03-21 — End: 2011-03-28

## 2011-03-21 MED ORDER — CEFTRIAXONE SODIUM 250 MG IJ SOLR
250.00 mg | Freq: Once | INTRAMUSCULAR | Status: AC
Start: 2011-03-21 — End: 2011-03-21
  Administered 2011-03-21: 250 mg via INTRAMUSCULAR
  Filled 2011-03-21: qty 250

## 2011-03-21 MED ORDER — AZITHROMYCIN 250 MG PO TABS
1000.00 mg | ORAL_TABLET | Freq: Once | ORAL | Status: AC
Start: 2011-03-21 — End: 2011-03-21
  Administered 2011-03-21: 1000 mg via ORAL
  Filled 2011-03-21: qty 4

## 2011-03-21 NOTE — ED Notes (Signed)
Pt medicated as ordered, medications explained via video interpretor.  Pt to remain in ED to evaluate for reaction to abx prior to discharge

## 2011-03-21 NOTE — Discharge Instructions (Signed)
DIAGNOSIS: Urinary tract infection, likely sexually transmitted disease    Your urine today does show signs of a urinary tract infection.  Your symptoms are also suggestive of a sexually transmitted disease.    You were treated for both gonorrhea and chlamydia with antibiotics (ceftriaxone and azithromycin) today. You will also be treated for UTI with cipro. Make sure you take the cipro until it is finished.     Avoid sexual activity for at least one week after your symptoms have resolved.     Call your primary care physician to make a follow up appointment.     Return or seek medical care if you develop fever, abdominal pain, vomiting, or any other worsening or concerning symptoms.

## 2011-03-21 NOTE — ED Provider Notes (Signed)
I have reviewed the ED nursing notes and prior records. I have reviewed the patient's past medical history/problem list, allergies, social history and medication list.  I saw this patient primarily.    History, physical exam and disposition were conducted with an official hospital [place] telephone interpreter.    HPI:  This 40 year old male patient presents with pain with urination for the last 20 days.  He denies any urethral discharge.  No scrotal swelling or pain.  Patient states that he did have unprotected sex with 2 women 30 days ago.  He also reports that in the last 6 months he has had 8 sexual partners.  He states that with those other partners he did use condoms during vaginal intercourse although he did receive oral sex without a condom at times.  He denies any fever.  No vomiting.  No back pain.  He does report some abdominal fullness today but no abdominal pain.  He denies any history of sexual transmitted infections.      ROS: Pertinent positives were reviewed as per the HPI above. All other systems were reviewed and are negative.    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  Current outpatient prescriptions ordered prior to encounter:  ibuprofen (MOTRIN) 600 MG tablet Take 1 tablet by mouth every 8 (eight) hours as needed for Pain. with food or milk Disp: 20 tablet Rfl: 0   oxycodone-acetaminophen (PERCOCET) 5-325 MG per tablet Take 1 tablet by mouth. TAKE 1 TABLET EVERY 4-6 HOURS AS NEEDED FOR ACUTE PAIN. Disp: 15 tablet Rfl: 0   hydrocodone-acetaminophen (VICODIN) 5-500  MG TABS 1 TABLET EVERY 4 TO 6 HOURS AS NEEDED Disp: 45 tablet Rfl: 0         Social History:     Smoking status: Never Smoker     Smokeless tobacco:     Alcohol Use: Yes  1.5 oz/week    3 drink(s) per week         Comment: Drank 10-20 drinks/day (beer) during weekends for 5 years; Cut back recently due to stomach pain       Allergies:  Review of Patient's Allergies indicates:  No Known Allergies    Physical Exam:  BP 109/67   Pulse 72   Temp 97.6 F   Resp 18   SpO2 100%  GENERAL:  Well-appearing, no distress.  SKIN:  Warm & Dry, no rash, no bruising.  HEAD:  Atraumatic.   NECK:  Supple, no midline tenderness.   LUNGS:  Clear to auscultation bilaterally without rales, rhonchi or wheezing.   HEART:  RRR.  No murmurs, rubs, or gallops.   ABDOMEN:  Soft, flat, without distension.  Nontender to palpation.   MUSCULOSKELETAL:  No deformities. Well-perfused extremities. No cyanosis or edema.  GENITOURINARY:  No CVA tenderness.  No rash or skin lesions to the external genitalia.  No scrotal swelling or tenderness.  No discharge from the urethra.  BACK: Nontender.   NEUROLOGIC: Awake alert, nonfocal.    ED Course and Medical Decision-making:    The patient is 40 year old male with dysuria in the setting of multiple sexual partners and intermittent condom use.  A urethral swab for Gen-Probe was obtained and sent.  Urine dip shows trace leukocytes.  We will send for official culture.  Given the patient's history and symptoms we will treat him for gonorrhea and Chlamydia with ceftriaxone and azithromycin.  Patient will also be treated for possible urinary tract infection with Cipro for one week.  He is advised to avoid sexual activity for a week after his symptoms have resolved.  He is advised to followup with his primary care physician.    Reasons to return to the ED were reviewed in detail. The patient agrees with this plan and disposition.    Disposition: Home    Condition on  Discharge:  Stable      Diagnosis/Diagnoses:  788.1 Dysuria  (primary encounter diagnosis)  597.80D Urethritis  599.0C UTI (urinary tract infection)        Rubye Strohmeyer C. Imogene Burn, MD

## 2011-03-21 NOTE — ED Notes (Signed)
Patient Disposition    Patient education for diagnosis, medications, activity, diet and follow-up.  Patient left ED 9:53 AM.  Patient rep received written instructions.  Interpreter to provide instructions: yes via video phone    Discharged to: Discharged to home

## 2011-03-21 NOTE — ED Triage Note (Signed)
Pt presents with 20 days of urinary frequency and burning.

## 2011-03-22 LAB — CHLAMYDIA GC NAAT
GENPROBE CHLAMYDIA: NEGATIVE
GENPROBE GC: NEGATIVE

## 2011-03-22 LAB — URINE CULTURE/COLONY COUNT: URINE CULTURE/COLONY COUNT: NO GROWTH

## 2011-06-27 ENCOUNTER — Encounter (HOSPITAL_BASED_OUTPATIENT_CLINIC_OR_DEPARTMENT_OTHER): Payer: Self-pay

## 2011-06-27 LAB — XR HAND RIGHT MINIMUM 3 VIEWS

## 2011-06-27 LAB — XR MINI C-ARM PACS STORAGE ONLY

## 2011-06-27 LAB — XR LUMBAR SPINE 2 OR 3 VIEWS

## 2011-10-23 ENCOUNTER — Encounter (HOSPITAL_BASED_OUTPATIENT_CLINIC_OR_DEPARTMENT_OTHER): Payer: Self-pay | Admitting: Internal Medicine

## 2011-10-23 ENCOUNTER — Ambulatory Visit (HOSPITAL_BASED_OUTPATIENT_CLINIC_OR_DEPARTMENT_OTHER): Payer: PRIVATE HEALTH INSURANCE | Admitting: Internal Medicine

## 2011-10-23 VITALS — BP 98/56 | HR 85 | Temp 97.9°F | Wt 134.0 lb

## 2011-10-23 DIAGNOSIS — R21 Rash and other nonspecific skin eruption: Secondary | ICD-10-CM

## 2011-10-23 MED ORDER — SELENIUM SULFIDE 2.5 % EX LOTN
TOPICAL_LOTION | Freq: Every evening | CUTANEOUS | Status: DC
Start: 2011-10-23 — End: 2014-07-01

## 2011-10-23 NOTE — Progress Notes (Signed)
41 y/o M PCP Dr Gaynell Face here for urgent care eval of "spots on body".     #Skin rash:  X 2 mo.  Initially hyperpigmentation around naval.  Spread around abdomen, chest and groin.  No itch, no pain.  Did have cold + fever symptoms (long resolved) just before onset.     ROS: No fever, no weight loss, no headache, no vision changes, no hearing changes, no oral lesions, no shortness of breath, no chest pain, no abdominal pain.    Medical Problems Include:  -ETOH abuse  -Dyspepsia    PE: 98/56, 85, 98%, 97.9, 134 lb  General: Well appearing  HEENT: Oral mucousa pink + moist.    ABD: Soft NT, ND.   Ex: No LE edema.  Skin: ~ 10 hyperpigmented papules across anterior lower abd and upper groin.  Neuro: Normal gait.     KOH skin scraping: Overt "spagetti + meatball" pattern    A&P  #Skin rash: Tinea versicolor by microscopy.  Unlikely pityriasis given microscopy findings. No overt signs or sx systemic disease.  -Selenium sulfide lotion  -Counseled re signs + sx to prompt re-eval    #F/U with PCP as needed.

## 2011-10-25 LAB — TREPONEMA PALLIDUM AB IGG: TREPONEMA PALLIDUM AB IgG: NONREACTIVE

## 2012-03-27 ENCOUNTER — Ambulatory Visit (HOSPITAL_BASED_OUTPATIENT_CLINIC_OR_DEPARTMENT_OTHER): Payer: PRIVATE HEALTH INSURANCE

## 2012-03-27 ENCOUNTER — Encounter (HOSPITAL_BASED_OUTPATIENT_CLINIC_OR_DEPARTMENT_OTHER): Payer: Self-pay

## 2012-03-27 VITALS — BP 102/68 | HR 90 | Temp 98.0°F | Wt 136.0 lb

## 2012-03-27 DIAGNOSIS — Z23 Encounter for immunization: Secondary | ICD-10-CM

## 2012-03-27 DIAGNOSIS — K649 Unspecified hemorrhoids: Secondary | ICD-10-CM

## 2012-03-27 DIAGNOSIS — F1010 Alcohol abuse, uncomplicated: Secondary | ICD-10-CM

## 2012-03-27 MED ORDER — HYDROCORTISONE 2.5 % PR CREA
TOPICAL_CREAM | Freq: Two times a day (BID) | RECTAL | Status: AC
Start: 2012-03-27 — End: 2012-04-03

## 2012-03-27 MED ORDER — PSYLLIUM 58.6 % PO PACK
1.00 | PACK | Freq: Every day | ORAL | Status: AC
Start: 2012-03-27 — End: 2012-06-25

## 2012-03-27 NOTE — Patient Instructions (Addendum)
You have small hemorrhoids that are likely causing the irritation around your anus. You decrease further enlargement of the hemorrhoids, you need to keep your stool soft and bulky. You can do this by increasing the amount of fiber in your diet. You can also buy fiber supplements such as METAMUCIL or CITRUCEL.  Take one glass in the morning and one glass at night for goal of soft bulky stools.     When you have a flare, you can apply a cream to your anus twice daily for one week. ONLY use when you have itchiness of that area, and do not use for more than one week, CALL if no resolution after 7 days of treatment.    If you have any questions or concerns please call [phone], and be sure to leave a phone number and time when it is best for [place] to call you back.      Voc tem pequenas hemorridas, que so provavelmente causando a irritao em torno do seu nus. Voc diminuir ainda mais a [employer], voc precisa manter suas fezes moles e volumosas. Voc pode fazer isso atravs do [employer] quantidade de fibras em sua dieta. Voc tambm pode comprar suplementos de fibras como Metamucil ou Citrucel. Tome um copo de manh e um copo  noite para o gol de fezes volumosas suaves.    Quando voc tem uma crise, voc pode aplicar um creme para o seu nus duas vezes por dia durante uma semana. S usar quando voc tem coceira daquela rea, e no usar por mais de uma semana, chamada Se nenhuma resoluo aps 7 dias de tratamento.    Se voc tiver dvidas ou preocupaes, ligue para [phone], e no se esquea de deixar um nmero de telefone e hora em que  melhor para ns, para cham-lo de [employer].

## 2012-03-27 NOTE — Progress Notes (Signed)
Pt requesting Flu Vaccine . Confirmed patient's name and date of birth. Pt denies allergies to this vaccine. Pt denies allergies to egg or egg products. Pt denies allergy to contact lens solution. Pt denies pregnancy.     Pt denies adverse effects from previous administration of this medication. Pt denies history of Guillain-Barre' syndrome. Pt denies moderate/severe illness at this time. Risks and benefits of Flu Vaccine reviewed with pt. VIS for Flu Vaccine offered and reviewed with pt.   Flu Vaccine 0.5.ml IM administered. Tolerated well by patient. Patient denies adverse effects from injection at this time. Patient encouraged to utilize arm and not favor it. Patient informed may take pain reliever of choice and to apply ice for discomfort if necessary. Patient will call with any questions or concerns. Please refer to Imm./Inj. section for administration site, lot # and exp. date.  Patient was encouraged to wait for twenty minutes in the lobby.

## 2012-03-27 NOTE — Progress Notes (Signed)
CC: Joshua Holt is a 41 year old patient who comes for: Follow Up      HPI:  1. Prostate problems: Joshua Holt is here today because he feels that he needs a test for his prostate. He reports that about 6 months ago he began having discomfort around the skin of anus. He states that he feels an itchy sensation on the inside and outside of his anus intermittently. It will last for several hours and resolve spontaneously. Not associated with particular foods. Denies any blood per rectum. Denies pain with wiping or with defecation.    Denies any recent travelling.    Of note, reports 3 days of loose, watery bowel movements. He does not feel that these are associated with the itchiness as his bowel movements have been normal for the past several months.    Reports no difficulty urinating. He reports strong stream, no hesitancy, and normal emptying of bladder. He denies any family history of prostate disease or prostate cancer.    2. Alcohol abuse: Patient reporting 6-12 drinks at night during week several nights a week. Reports only a few drinks this week, but states that he drank nearly every night last week, upwards of 6 beers each night. He does not feel that his drinking is a problem, and denies any problems related to his drinking. He reports that he never misses work, he has no discord in relationships due to his drinking, he state that he does not feel his drinking bothers anybody else, nor has anyone ever told him that it bothers them. He states that he has thought about cutting back because he is told "by doctors" that drinking too much can be a problem, but again, he does not feel that this applies to his drinking.    PMH/PSH: see problem list  ALLERGIES:  has no known allergies.  MEDS: see medication list  SH and FH: as noted in EPIC    ROS: SEE HPI    PE: Vitals: BP 102/68   Pulse 90   Temp(Src) 98 F (36.7 C) (Oral)   Wt 136 lb (61.689 kg)   SpO2 99%    General appearance: WD/WN in NAD  HEENT :  NC/AT, sclerae anicteric, PERRLA, TM clear x 2, OP clear, nl dentition, fundi nl x 2  Neck: supple w/o masses, trachea midline, no LAD, no thyromegaly, no JVD  Lungs: CTAB, no w/r/r  Cardiac: RRR, normal S1/S2, no m/r/g  Abdomen: soft, non-tender, no guarding, liver not palpable beneath the costal margin, percussed to 8cm in size, skin did not reveal prominent veins or spider angiomata  Male GU: no penile lesions or discharge, testicles equal w/o masses, no hernia  External anal exam notable for non-thrombosed chronic external hemorrhoid, DRE normal prostate exam, smooth, no nodules, no internal hemorrhoids palpable. Anoscopy not performed  Extremities: no c/c/e, pulses 2+ throughout bilaterally. Normal monofilament exam  Joint: no swelling/tenderness/redness/warmth, nl ROM  LN: no cervical/supraclavicular/axillary/inguinal LAD  Neuro: A&Ox3. CN : II-XII nl. Motor and Sensory grossly intact. Intact vibratory and light touch sensation of feet bilaterally    ASSESSMENT AND PLAN:  (455.6) Unspecified hemorrhoids without mention of complication  (primary encounter diagnosis)  Comment: History and exam consistent with chronic hemorrhoids with flares. Discussed the need to keep stool soft and bulky with dietary fiber and/or fiber supplements. Also discussed the role of alcohol and changes in the liver with hemorrhoids. Advised to reduce alcohol intake. Also, prescribed psyllium for bulkying of stools along with  ANUSOL-HCL for use when he has a flare.  Plan:   1. Keep stools soft with fiber  2. Psyllium one glass daily  3. ANUSOL-HCL applied BID x 7 days during flares  4. Advised to call or return if flares do not resolve within 7 days    (305.00) Alcohol Abuse  Comment: Ongoing discussion with patient RE alcohol use. He denies that it is a problem. Re-iterated health risks of excessive consumption, adding to it the possible exacerbation of hemorrhoidal disease. Patient acknowledged plan to reduce alcohol intake to 2 or  fewer drinks a night. Not interested in other interventions at this time.  Plan:  1. Continue to discuss reduction of alcohol  2. Agreed to reduce to 2 or fewer drinks daily  3. Will re-iterate and reassess at next appt    (V04.81) Need for prophylactic vaccination and inoculation against influenza  Plan: IMMUNIZATION ADMIN SINGLE, RN, INFLUENZA VIRUS         VACCINE SPLIT VIRUS 3/> YRS IM    The patient indicates understanding of these issues and agrees with the plan. Follow-up is planned in 6 months.   Joshua. Holt has been advised to call or return with any worsening or new problems.                         Influenza Vaccine Procedure  March 27, 2012    1. Has the patient received the information for the influenza vaccine? Yes    2. Does the patient have any of the following contraindications?  Allergy to eggs? No  Allergic reaction to previous influenza vaccines? No  Any other problems to previous influenza vaccines? No  Paralyzed by Guillain-Barre syndrome?  No  Current moderate or severe illness? No  Allergy to contact lens solution? No    3. The vaccine has been administered in the usual fashion.     Immunization information reviewed. Current VIS reviewed and given to patient/ guardian. Verbal assent obtained from patient/ guardian.  See immunization/Injection module or chart review for date of publication and additional information. Verbal assent obtained from patient/guardian. Comfort measures for possible side effects reviewed.

## 2012-04-06 NOTE — Progress Notes (Signed)
PRECEPTOR NOTE  On the day of the patient's visit, I personally saw and evaluated the patient. In addition, I reviewed findings with the resident. I confirm the key elements of history and physical exam as described in resident's note.  I agree with the assessment and plan as described below.  Please see resident's note for further details.

## 2012-04-14 ENCOUNTER — Encounter (HOSPITAL_BASED_OUTPATIENT_CLINIC_OR_DEPARTMENT_OTHER): Payer: Self-pay | Admitting: Registered Nurse

## 2012-04-14 ENCOUNTER — Telehealth (HOSPITAL_BASED_OUTPATIENT_CLINIC_OR_DEPARTMENT_OTHER): Payer: Self-pay

## 2012-04-14 ENCOUNTER — Emergency Department (HOSPITAL_BASED_OUTPATIENT_CLINIC_OR_DEPARTMENT_OTHER)
Admission: RE | Admit: 2012-04-14 | Disposition: A | Discharge status: Home / Self Care | Payer: Self-pay | Source: Emergency Department | Attending: Emergency Medicine | Admitting: Emergency Medicine

## 2012-04-14 DIAGNOSIS — F32A Depression, unspecified: Secondary | ICD-10-CM

## 2012-04-14 DIAGNOSIS — F329 Major depressive disorder, single episode, unspecified: Principal | ICD-10-CM

## 2012-04-14 LAB — COMPREHENSIVE METABOLIC PANEL
ALANINE AMINOTRANSFERASE: 26 IU/L (ref 10–40)
ALBUMIN: 4.7 g/dl (ref 3.4–4.8)
ALKALINE PHOSPHATASE: 61 IU/L (ref 25–106)
ANION GAP: 10 mmol/L (ref 3–11)
ASPARTATE AMINOTRANSFERASE: 23 IU/L (ref 8–34)
BILIRUBIN TOTAL: 0.6 mg/dl (ref 0.2–1.1)
BUN (UREA NITROGEN): 17 mg/dl (ref 6–20)
CALCIUM: 9.6 mg/dl (ref 8.6–10.3)
CARBON DIOXIDE: 28 mmol/L (ref 22–32)
CHLORIDE: 99 mmol/L — ABNORMAL LOW (ref 101–111)
CREATININE: 0.9 mg/dl (ref 0.7–1.2)
ESTIMATED GLOMERULAR FILT RATE: >60 mL/min (ref >60)
Glucose Random: 121 mg/dl (ref 74–160)
POTASSIUM: 4.1 mmol/L (ref 3.5–5.1)
SODIUM: 137 mmol/L (ref 135–144)
TOTAL PROTEIN: 7.3 g/dl (ref 5.9–7.5)

## 2012-04-14 LAB — CBC, PLATELET & DIFFERENTIAL
ABSOLUTE BASO COUNT: 0 10*3/uL (ref 0.0–0.1)
ABSOLUTE EOSINOPHIL COUNT: 0.3 10*3/uL (ref 0.0–0.8)
ABSOLUTE IMM GRAN COUNT: 0.05 10*3/uL — ABNORMAL HIGH (ref 0.00–0.03)
ABSOLUTE LYMPH COUNT: 3.5 10*3/uL (ref 0.6–5.9)
ABSOLUTE MONO COUNT: 0.9 10*3/uL (ref 0.2–1.4)
ABSOLUTE NEUTROPHIL COUNT: 4.6 10*3/uL (ref 1.6–8.3)
BASOPHIL %: 0.4 % (ref 0.0–1.2)
EOSINOPHIL %: 2.8 % (ref 0.0–7.0)
HEMATOCRIT: 51.8 % — ABNORMAL HIGH (ref 40.1–51.0)
HEMOGLOBIN: 17.3 g/dL (ref 13.7–17.5)
IMMATURE GRANULOCYTE %: 0.5 % — ABNORMAL HIGH (ref 0.0–0.4)
LYMPHOCYTE %: 37 % (ref 15.0–54.0)
MEAN CORP HGB CONC: 33.4 g/dL (ref 31.0–37.0)
MEAN CORPUSCULAR HGB: 28.1 pg (ref 26.0–34.0)
MEAN CORPUSCULAR VOL: 84.2 fL (ref 80.0–100.0)
MEAN PLATELET VOLUME: 11.3 fL (ref 8.7–12.5)
MONOCYTE %: 9.8 % (ref 4.0–13.0)
NEUTROPHIL %: 49.5 % (ref 40.0–75.0)
PLATELET COUNT: 299 10*3/uL (ref 150–400)
RBC DISTRIBUTION WIDTH STD DEV: 38.8 fL (ref 35.1–46.3)
RBC DISTRIBUTION WIDTH: 12.7 % (ref 11.5–14.3)
RED BLOOD CELL COUNT: 6.15 M/uL — ABNORMAL HIGH (ref 4.60–6.10)
WHITE BLOOD CELL COUNT: 9.4 10*3/uL (ref 4.0–11.0)

## 2012-04-14 LAB — SERUM DRUG SCREEN 6 DRUGS
ACETAMINOPHEN: <10 ug/ml — ABNORMAL LOW (ref 10–30)
BARBITURATE: NEGATIVE
BENZODIAZEPINE: NEGATIVE
ETHANOL: <10 mg/dl (ref <10)
SALICYLATE: <4 mg/dl — ABNORMAL LOW (ref 4–29)
TRICYCLIC ANTIDEPRESSANTS: NEGATIVE

## 2012-04-14 NOTE — ED Notes (Signed)
Sandwich and drink provided to patient.

## 2012-04-14 NOTE — Telephone Encounter (Signed)
Message copied by Sharlyne Pacas on Mon Apr 14, 2012  3:40 PM  ------       Message from: Marinus Maw       Created: Mon Apr 14, 2012  3:35 PM       Contact: [phone]                       Rowan Bouwkamp [id number], 41 year old, male, Telephone Information:       Home Phone      [phone]       Work Phone      Not on file.       Mobile          Not on file.       Home Phone      [phone]                     Patient's Preferred Pharmacy:               [facility]       Phone: [phone] Fax: [phone]                     CONFIRMED TODAY: Lovett Sox NUMBER: [phone]       Best time to call back: anytime       Cell phone:        Other phone:              Available times:              Patient's language of care: [place]              Patient does not need an interpreter.              Patient's PCP: Jinny Blossom, MD              Person calling on behalf of patient: Lauro [facility])              Calls today calling about pt who has been having suicidal thoughts for the last few days- patient looking to see a phsyc provider but needs referral from pcp. Clydie Braun would like to speak to someone.                       ------

## 2012-04-14 NOTE — Progress Notes (Addendum)
Called dr Gaynell Face not available today  Paged dr stark re patient  And message forwarded

## 2012-04-14 NOTE — ED Provider Notes (Addendum)
I have reviewed the ED nursing notes and prior records. I have reviewed the patient's past medical history/problem list, allergies, social history and medication list.  I saw this patient primarily.    History, physical exam and disposition were conducted with an official hospital [place] interpreter.    HPI:  This 41 year old male patient presents reporting depression and feeling like he needs to speak to a psychiatrist.  He does report he felt suicidal yesterday without specific plan, but is feeling improved today.  He does drink alcohol.  He denies any fevers, chest pain, difficulty breathing, belly pain, vomiting, or any other physical symptoms.    ROS: Pertinent positives were reviewed as per the HPI above. All other systems were reviewed and are negative.    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  Current Outpatient Prescriptions on File Prior to Encounter:  psyllium sugar free 58.6 % Take 1 packet by mouth daily. Disp: 30 packet Rfl: 2   ibuprofen (MOTRIN) 600 MG tablet Take 1 tablet by mouth every 8 (eight) hours as needed for Pain. with food or milk Disp: 20 tablet Rfl: 0   oxycodone-acetaminophen (PERCOCET) 5-325 MG per tablet Take 1 tablet by mouth. TAKE 1 TABLET EVERY 4-6 HOURS AS NEEDED FOR ACUTE PAIN. Disp: 15 tablet Rfl: 0   hydrocodone-acetaminophen (VICODIN) 5-500 MG TABS 1 TABLET EVERY 4 TO 6 HOURS AS NEEDED Disp: 45 tablet Rfl: 0       Social History:     Smoking status: Never Smoker     Smokeless tobacco:     Alcohol Use:  Yes  5.0 oz/week    3 Drinks containing 0.5 oz of alcohol, 7 Cans of beer per week         Comment: Drank 10-20 drinks/day (beer) during weekends for 5 years; Cut back recently due to stomach pain       Allergies:  Review of Patient's Allergies indicates:  No Known Allergies    Physical Exam:  BP 152/95   Pulse 71   Temp(Src) 97.8 F   Resp 18   Wt 60 kg   SpO2 100%  GENERAL:  Well-appearing, no distress.  SKIN:  Warm & Dry, no rash, no bruising.  HEAD:  Atraumatic. PERRL.  NECK:  Supple, no midline tenderness.   LUNGS:  Clear to auscultation bilaterally without rales, rhonchi or wheezing.   HEART:  RRR.  No murmurs, rubs, or gallops.   ABDOMEN:  Soft, flat, without distension.  Nontender to palpation.   MUSCULOSKELETAL:  No deformities. Well-perfused extremities. No cyanosis or edema.  GENITOURINARY:  No CVA tenderness.   BACK: Nontender.  NEUROLOGIC: Awake alert, nonfocal.    ED Course and Medical Decision-making:    The patient is 41 year old male with depression.  He reports feeling suicidal yesterday although he reports he no longer feels suicidal today.  Physical exam unremarkable  and no acute medical concerns.  Discussed with the patient about outpatient followup in one of our mental health clinics, with possible medication and counseling. Patient was unhappy with this plan and wished to leave. I attempted to contract with the patient for safety, but he was unable to do so, and so a safety watch has been initiated, and [facility] has been called to evaluate the patient. Screening labwork sent and is unremarkable. Patient is medically cleared, and is signed out to Dr. Arnold Long pending psychiatric evaluation.     Disposition: Pending    Condition:   Stable    Diagnosis/Diagnoses:  Depression    Jerrell Mylar    16:45  ADDENDUM:   Patient was seen by the [facility] team, does not currently have SI, and is cleared for discharge.  They spoke with pt's PCP, who wrote for a psychiatric referral to allow patient to establish care at  an outpatient center they recommended.  Patient will be discharged with the contact information for this clinic provided by [facility], as well as [facility] number.  Given return instructions.    DISPO: discharge in improved and stable condition  DIAGNOSIS: Depression    Everlene Farrier, MD

## 2012-04-14 NOTE — ED Notes (Signed)
Continued Furniture conservator/restorer. Pt remains quiet, cooperative.  Reviewed with Probation officer.

## 2012-04-14 NOTE — ED Notes (Signed)
Eval by Dr. Imogene Burn.    Public Safety ordered for watch.  Pt amb to bathroom and returned to Room.    Public Safety at door with direct visual.  Nursing report to Probation officer.

## 2012-04-14 NOTE — ED Notes (Signed)
Dr. Chen at bedside.

## 2012-04-14 NOTE — ED Notes (Addendum)
Labs drawn.  Public Safety in direct view of patient. [facility] Team called for patient evaluation.

## 2012-04-14 NOTE — ED Notes (Signed)
Pt cleared by [facility], ok for discharge.  Plan to follow up with [facility] outpatient

## 2012-04-14 NOTE — ED Triage Note (Signed)
Arrives to ED with steady gait. Via interpreter, patient reports he came "sad" after verbal discussion with his boss at work 3 days ago.  Reports last night he felt his life was "meaningless".  Denies SI. Denies HI. Denies physical abuse. Pt decided not to return to work.  Denies hx of SI/HI, anxiety or depression.  Reports drinking 6-7 beers "or more" but does not relate this to his sadness and does not believe his alcohol use is a problem.  Today he feels less sad but would like to talk with someone.  Ate a normal breakfast. Good eye contact.  Contracts for safety in ED.

## 2012-04-14 NOTE — ED Notes (Signed)
Patient Disposition    Patient education for diagnosis, medications, activity, diet and follow-up.  Patient left ED 4:55 PM.  Patient rep received written instructions.  Interpreter to provide instructions: No    Discharged to: Discharged to home

## 2012-04-14 NOTE — Discharge Instructions (Signed)
FOLLOW UP WITH THE OUTPATIENT CLINIC RECOMMENDED BY THE [facility] TEAM.  YOU CAN ALSO CALL THEM WITH QUESTIONS.  RETURN TO THE ER IF YOU DON'T FEEL YOU CAN KEEP YOURSELF SAFE, OR IF YOU HAVE ANY OTHER CONCERNS.

## 2012-04-14 NOTE — ED Notes (Signed)
[facility] team in to evaluate patient.

## 2012-04-14 NOTE — Progress Notes (Signed)
Spoke with Joshua Holt who evaluated pt in ER  Reports he has passive SI without plan or intent  She feels he is safe for d/c from ER  Would like 'expedited referral to mental health from PCP'  Dr Gaynell Face not available  I will place referral but also emphasized that we have fewer providers at present and many pts to absorb given recent loss of mental health providers in our practice; I'm unsure of timeline for him to be seen in mental health given these factors; she is aware and will provide counseling and provide resources   i will place referral

## 2012-04-21 ENCOUNTER — Telehealth (HOSPITAL_BASED_OUTPATIENT_CLINIC_OR_DEPARTMENT_OTHER): Payer: Self-pay

## 2012-04-21 NOTE — Progress Notes (Signed)
l/m with interpreter to book appt with pcp,Marshall in next available slot.

## 2012-04-21 NOTE — Telephone Encounter (Signed)
Message copied by Laury Deep on Mon Apr 21, 2012  3:52 PM  ------       Message from: [place], [place]       Created: Mon Apr 21, 2012  3:32 PM       Regarding: Schedule appt to be seen         Can we please schedule Mr Ertel with me at my next open appt.       Thanks.       jm  ------

## 2012-04-21 NOTE — Patient Instructions (Addendum)
l/m with interpreter to book appt with pcp,Marshall in next available slot.

## 2012-04-30 ENCOUNTER — Ambulatory Visit (HOSPITAL_BASED_OUTPATIENT_CLINIC_OR_DEPARTMENT_OTHER): Payer: PRIVATE HEALTH INSURANCE

## 2012-05-26 ENCOUNTER — Ambulatory Visit (HOSPITAL_BASED_OUTPATIENT_CLINIC_OR_DEPARTMENT_OTHER): Payer: PRIVATE HEALTH INSURANCE | Admitting: Psychiatry

## 2012-05-26 DIAGNOSIS — Z789 Other specified health status: Secondary | ICD-10-CM

## 2012-05-26 DIAGNOSIS — F1090 Alcohol use, unspecified, uncomplicated: Secondary | ICD-10-CM

## 2012-05-26 DIAGNOSIS — F419 Anxiety disorder, unspecified: Secondary | ICD-10-CM

## 2012-05-26 DIAGNOSIS — Z7289 Other problems related to lifestyle: Secondary | ICD-10-CM
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
## 2012-05-30 ENCOUNTER — Telehealth (HOSPITAL_BASED_OUTPATIENT_CLINIC_OR_DEPARTMENT_OTHER): Payer: Self-pay

## 2012-05-30 NOTE — Progress Notes (Signed)
l/m on V/M book f/u appt with Dr Marshall

## 2012-05-30 NOTE — Patient Instructions (Signed)
l/m on V/M book f/u appt with Dr Marshall

## 2012-05-30 NOTE — Telephone Encounter (Signed)
Message copied by Laury Deep on Fri May 30, 2012  3:44 PM  ------       Message from: [place], [place]       Created: Fri May 30, 2012  3:09 PM       Regarding: Schedule follow-up         Please schedule follow up with me after recent ED visit and BH visit for SI and "heart concerns".       Thanks, jm  ------

## 2012-12-18 ENCOUNTER — Telehealth (HOSPITAL_BASED_OUTPATIENT_CLINIC_OR_DEPARTMENT_OTHER): Payer: Self-pay | Admitting: Licensed Practical Nurse

## 2012-12-18 NOTE — Progress Notes (Signed)
.  Pt stated that now is not a good time to book appt.  He will call us at a time that is convenient for him

## 2013-08-26 ENCOUNTER — Ambulatory Visit (HOSPITAL_BASED_OUTPATIENT_CLINIC_OR_DEPARTMENT_OTHER): Payer: PRIVATE HEALTH INSURANCE

## 2013-08-26 ENCOUNTER — Encounter (HOSPITAL_BASED_OUTPATIENT_CLINIC_OR_DEPARTMENT_OTHER): Payer: Self-pay

## 2013-08-26 VITALS — BP 104/68 | HR 92 | Temp 98.6°F | Ht 68.5 in | Wt 137.0 lb

## 2013-08-26 DIAGNOSIS — Z0000 Encounter for general adult medical examination without abnormal findings: Secondary | ICD-10-CM

## 2013-08-26 DIAGNOSIS — Z87898 Personal history of other specified conditions: Secondary | ICD-10-CM | POA: Insufficient documentation

## 2013-08-26 DIAGNOSIS — F1010 Alcohol abuse, uncomplicated: Secondary | ICD-10-CM

## 2013-08-26 DIAGNOSIS — H538 Other visual disturbances: Secondary | ICD-10-CM

## 2013-08-26 LAB — BASIC METABOLIC PANEL
ANION GAP: 9 mmol/L (ref 5–15)
BUN (UREA NITROGEN): 20 mg/dL — ABNORMAL HIGH (ref 7–18)
CALCIUM: 9.3 mg/dL (ref 8.5–10.1)
CARBON DIOXIDE: 30 mmol/L (ref 21–32)
CHLORIDE: 104 mmol/L (ref 98–107)
CREATININE: 1.1 mg/dL (ref 0.7–1.2)
ESTIMATED GLOMERULAR FILT RATE: >60 mL/min (ref >60)
Glucose Random: 98 mg/dL (ref 74–160)
POTASSIUM: 4.1 mmol/L (ref 3.5–5.1)
SODIUM: 143 mmol/L (ref 136–145)

## 2013-08-26 LAB — LOW DENSITY LIPOPROTEIN DIRECT: LOW DENSITY LIPOPROTEIN DIRECT: 127 mg/dL (ref 0–189)

## 2013-08-26 LAB — HIGH DENSITY LIPOPROTEIN: HIGH DENSITY LIPOPROTEIN: 59 mg/dL (ref 40–?)

## 2013-08-26 LAB — CHOLESTEROL: Cholesterol: 186 mg/dL (ref 0–239)

## 2013-08-26 NOTE — Progress Notes (Addendum)
[facility]    CC: Establish new PCP    HPI: Joshua Holt is a 43 year old male with PMHx of previous heavy alcohol use and distant hx of convulsions (age 32) who presents to clinic to establish new PCP care. Prior PCP is Dr Deboraha Sprang. Telephone interpreter present, but difficult historian:    1. Alcoholism: Used to drink 6-12 drinks nightly, stopped drinking 1 yr ago per physician recommendation. Pt was always highly functional and never misses work. He did not think alcohol was an issue in the past. Reports alcohol use once a week now, 4-5 beers. Had hx of elevated LFT back in 2012 (AST 193, ALT 51, ALP 83), but this normalized in 03/2012.     2. Hx of convulsions. Pt reports hx of convulsions and had neuro studies done 20 yrs ago in [place]. Reports last seizure at age 8. Was previously on antiepileptics X 2 years back in [place] (cannot recall name), but afterwards stopped medications himself due to not liking chronic meds. Pt is now concerned due to blurry vision, headache, and right forearm pain which he thinks might be related to seizures (see below). He has not seen an eye doctor for many years.     3. Headache and blurry vision: Pt is concerned about seizures due to recent symptoms. Complains of blurry vision this morning that happened without obvious precipitant. Symptoms lasted 1 hr, described as blurry vision with reading, but denies loss of vision or LOC. This was followed by diffuse headache that lasted 2 hours. Denies chronic headaches. Not on chronic medications. Denies allergies. Denies any observed seizures in recent years, denies LOC.     Pt also complains of one episode of right forearm pain three weeks ago, described as "nerve pulling sensation." Symptoms overall resolved. 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 A1C 4.9 back in 2012.       Review of systems:   Constit: No fevers, shakes/chills, or unexplained weight loss.   HEENT: +acute  blurry vision. No hearing changes. No sore throat or ear ache.   CV: No chest pain. No palpitations.  Pulm: No cough. No wheezing. No dyspnea.    GI: No nausea, vomiting, constipation, diarrhea. +non-specific intermittent abdominal pain.  MSK: No new or unusual musculoskeletal symptoms. No new rashes or skin changes.   Neuro/Psych: No paresthesias or new headaches. No sadness or anxiety that interferes with day-to-day activities.   Endo: No enlarged nodes.    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 with food or milk Disp: 20 tablet Rfl: 0   oxycodone-acetaminophen (PERCOCET) 5-325 MG per tablet Take 1 tablet by mouth. TAKE 1 TABLET EVERY 4-6 HOURS AS NEEDED FOR ACUTE PAIN. Disp: 15 tablet Rfl: 0   hydrocodone-acetaminophen (VICODIN) 5-500 MG TABS 1 TABLET EVERY 4 TO 6 HOURS AS NEEDED Disp:  45 tablet Rfl: 0     No current facility-administered medications on file prior to visit.    Family History:  family history includes Alcohol/Drug Abuse in his father; Diabetes in his mother.    Social History:   Social History Narrative    Born in [place]; moved to the [place] in 2006 for financial reasons.        No family in [place]; remain in [place].    Currently working as a Education administrator        Lives with friends and his brother        2 children in [place]; his wife has 2 more children. All 4 live in [place]        Describes difficulty living so far away from family. He misses his family greatly, and his relationship with  his wife has changed a lot. They have stayed in touch for the children, but he has not seen her in 5 years.        Exam  BP 104/68   Pulse 92   Temp(Src) 98.6 F (37 C) (Oral)   Ht 5' 8.5" (1.74 m)   Wt 137 lb (62.143 kg)   BMI 20.53 kg/m2   SpO2 98%  Pain Score: 0 (0/10)    General: skinny middle age male appears older than stated age, NAD, alert, oriented   HEENT: PERRL, EOMI, anicteric, no conjunctival pallor.  Un-dilated fundoscopic exam: normal. No erythema or exudates.  Neck supple, thyroid gland not enlarged. No cervical, supraclavicular or infraclavicular lymphadenopathy.    Heart: RRR. Normal S1/S2. No murmurs.  Lung: CTABL. Normal excursion.  Abd:  Skinny, soft, non-tender, non-distended. Normoactive bowel sounds. No hepatosplenomegaly. No guarding or rebound.  MSK: right forearm pain no longer present, not reproducible upon palpation.   GU: normal appearing circumcised male external genitalia.  Bilaterally descended testicles. No inguinal hernia.  No abnormal bumps, lumps, masses noted on palpation.    Neuro: EOMI intact without diplopia. Moving all 4 extremities with symmetric 5/5 strength., no gross deficits. Intact finger-to-nose test and rapid alternating mov'ts. Normal gait.    Psychiatric: Affect is appropriate, mood is good. Speech rate and prosody is normal.       ASSESMENT/PLAN:  (V70.0) Routine general medical examination at a health care facility  (primary encounter diagnosis)  Comment: 59M with prior heavy alcohol abuse but highly functional who presents to establish new PCP. His concerns today is blurry vision, headache, and hx of convulsions (see below). His exam today as well as thorough neuro exam was WNL. We will obtain routine blood work today.  Plan:   - HEMOGLOBIN A1C, BASIC METABOLIC PANEL, CHOLESTEROL, HIGH DENSITY LIPOPROTEIN, LOW DENSITY LIPOPROTEIN,DIRECT  - no new medications today.    (368.8) Blurry vision, bilateral  Comment: Isolated acute episode of blurry vision which the  patient is concerned may be related to hx of convulsion. His headache and blurry vision have resolved by office visit, no focal deficits noted on exam. Pt reports not being seen by eye doctors for many years, so we will make referral today.  Plan:   - REFERRAL TO OPHTHALMOLOGY ( INT): evaluation of blurry vision.    (V12.49) History of convulsions  Comment: No medical documents available for review. Pt reports sz activity in age 89 in [place] for which he was given anti-epileptics X 2 yrs, but stopped himself as he did not want chronic meds. Since that time denies any LOC or further observed sz activities. He is however concerned  that his headache and blurry vision may be related to convulsions, which is unlikely.  Plan:   - curbside Neuro: whether EEG and/or neuro referral is appropriate   - recommend tylenol/NSAIDs for headache for now given normal neuro exam.    UPDATE: Per neuro consult, presentation unlikely due to sz. EEG and neuro referral not necessary.      (305.00) Alcohol abuse  Comment: Hx of heavy alcohol use (6-12 drinks per night) but stopped 1 yr ago per MD recommendation. He reports weekend use of alcohol 4-5 drinks. His LFT was elevated in 05/2010 but resolved in most recent LFT back in 03/2012.  Plan:   - discussed repeating LFT today but ultimately decided it was low utility.  - discussed with pt he should limited to 2 drinks in one sitting.      Health maintenance: up to date    Follow-up scheduled for 6 months. Pt has been advised to call if any worsening or new problems. Patient seen, examined, and discussed with Dr. Greer EePieter Cohen.      Lovette Clichehin Ho Ileta Ofarrell MD, PGY1  Pager [phone]

## 2013-08-26 NOTE — Progress Notes (Signed)
PRECEPTOR NOTE  On the day of the patient's visit, I personally saw and evaluated the patient. In addition, I reviewed findings with the resident. I confirm the key elements of history and physical exam as described in resident's note.  I agree with the assessment and plan as described below.  Please see resident's note for further details.

## 2013-08-28 LAB — HEMOGLOBIN A1C
ESTIMATED AVERAGE GLUCOSE: 82 (ref 74–160)
HEMOGLOBIN A1C: 4.5 % (ref 4.0–5.6)

## 2013-08-31 ENCOUNTER — Telehealth (HOSPITAL_BASED_OUTPATIENT_CLINIC_OR_DEPARTMENT_OTHER): Payer: Self-pay

## 2013-08-31 NOTE — Progress Notes (Addendum)
Left voicemail for pt on 08/31/13 regarding lab results. Telephone interpreter present.    1. Lab results overall normal. (formal letter will be sent to patient)  2. Spoke to neurology regarding his blurry vision. This is UNlikely due to his hx of seizures. Instead he should see ophtho which is scheduled for him on 10/14/13.     Informed pt to call us with questions.    -Lovette Clichehin Ho Adair Lemar, PGY1, [phone]

## 2013-10-14 ENCOUNTER — Ambulatory Visit (HOSPITAL_BASED_OUTPATIENT_CLINIC_OR_DEPARTMENT_OTHER): Payer: PRIVATE HEALTH INSURANCE | Admitting: Ophthalmology

## 2013-10-14 DIAGNOSIS — H11001 Unspecified pterygium of right eye: Secondary | ICD-10-CM

## 2013-10-14 DIAGNOSIS — G43109 Migraine with aura, not intractable, without status migrainosus: Secondary | ICD-10-CM | POA: Insufficient documentation

## 2013-10-14 DIAGNOSIS — Z0100 Encounter for examination of eyes and vision without abnormal findings: Secondary | ICD-10-CM

## 2013-10-14 NOTE — Nursing Note (Signed)
New pt eye exam     C/O: Sudden decreased VA D + N 3 weeks lasting about 1 hour.  No pain, No headache, NO trauma     Per pt VA is back to normal.  Pt states he can see very well at distance and near/reading SC.    -surgery -injury

## 2013-10-14 NOTE — Progress Notes (Signed)
Impression:  Ocular migraine.  Small nasal pterygium OD    Plan:  Reassured of ocular health  Patient will call with recurrence.  Otherwise see 2 years for routine care.

## 2013-10-14 NOTE — Nursing Note (Signed)
Patient describes a visual disturbance that occurred 3 weeks ago where the vision of both eyes was affected.  The episode lasted approximately 45 minutes from start to finish and was unassociated with other neurologic symptoms.  He had experienced similar episodes of visual disturbances in the remote past but none lately.  The vision has been fine since.

## 2013-11-16 ENCOUNTER — Emergency Department (HOSPITAL_BASED_OUTPATIENT_CLINIC_OR_DEPARTMENT_OTHER)
Admission: RE | Admit: 2013-11-16 | Disposition: A | Discharge status: Home / Self Care | Payer: Self-pay | Source: Emergency Department | Attending: Emergency Medicine | Admitting: Emergency Medicine

## 2013-11-16 ENCOUNTER — Encounter (HOSPITAL_BASED_OUTPATIENT_CLINIC_OR_DEPARTMENT_OTHER): Payer: Self-pay

## 2013-11-16 MED ORDER — IBUPROFEN 600 MG PO TABS
600.0000 mg | ORAL_TABLET | Freq: Four times a day (QID) | ORAL | Status: DC | PRN
Start: 2013-11-16 — End: 2013-11-19

## 2013-11-16 NOTE — ED Notes (Signed)
Patient Disposition    Patient education for diagnosis, medications, activity, diet and follow-up with PCP  Patient left ED 8:44 AM.  Patient  received written instructions.  Interpreter to provide instructions: Yes/ telephone    Have all existing LDAs been addressed? N/A    Have all IV infusions been stopped? N/A    Discharged to: Discharged to home

## 2013-11-16 NOTE — ED Triage Note (Signed)
Pt states he has a sore throat for 8 days. Pt denies fever

## 2013-11-18 NOTE — ED Provider Notes (Signed)
ED Provider Note    ED Course & MDM:    Pt sx are likely viral; do not suspect strep throat/PTA; pt treated with motrin.    Pt reassured re: tiny pustule on upper lip; advised to soak with warm washcloth, f/u with pcp.   Impression:  Sore throat  (primary encounter diagnosis)  Pustule   HPI:  This 43 year old male patient presents with chief complaint of sore throat, mild runny nose/HA for the past week. No f/c/n/v/d. No meds taken. Pt also c/o small lump to upper lip.  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  Current Outpatient Prescriptions:  ibuprofen (ADVIL,MOTRIN) 600 MG tablet Take 1 tablet by mouth every 6 (six) hours as needed for Pain or Fever. Disp: 30 tablet Rfl: 0   oxycodone-acetaminophen (PERCOCET) 5-325 MG per tablet Take 1 tablet by mouth. TAKE 1 TABLET EVERY 4-6 HOURS AS NEEDED FOR ACUTE PAIN. Disp: 15 tablet Rfl: 0   hydrocodone-acetaminophen (VICODIN) 5-500 MG TABS 1 TABLET EVERY 4 TO 6 HOURS AS NEEDED Disp: 45 tablet Rfl: 0      SH:     Smoking status: Never Smoker     Smokeless tobacco: Not on file    Alcohol Use: Yes  5.0 oz/week    3 Drinks containing 0.5 oz of alcohol, 7 Cans of beer per week         Comment: Drank  10-20 drinks/day (beer) during weekends for 5 years; Cut back recently due to stomach pain    Allergies:  Review of Patient's Allergies indicates:  No Known Allergies     I have reviewed the ED nursing notes and prior records. I have reviewed the patient's past medical history/problem list, allergies, social history and medication list. Prior records as available electronically through the Epic record were reviewed.     Marlena Clipperhomas Seini Lannom, MD, FACEP  Attending Physician, [place]  Clinical Instructor, [employer]  Director of Emergency Informatics, [facility]

## 2013-11-19 ENCOUNTER — Telehealth (HOSPITAL_BASED_OUTPATIENT_CLINIC_OR_DEPARTMENT_OTHER): Payer: Self-pay | Admitting: Registered Nurse

## 2013-11-19 ENCOUNTER — Encounter (HOSPITAL_BASED_OUTPATIENT_CLINIC_OR_DEPARTMENT_OTHER): Payer: Self-pay | Admitting: Nurse Practitioner

## 2013-11-19 ENCOUNTER — Ambulatory Visit (HOSPITAL_BASED_OUTPATIENT_CLINIC_OR_DEPARTMENT_OTHER): Payer: PRIVATE HEALTH INSURANCE | Admitting: Nurse Practitioner

## 2013-11-19 VITALS — BP 100/60 | HR 70 | Temp 98.0°F | Wt 131.4 lb

## 2013-11-19 DIAGNOSIS — R070 Pain in throat: Secondary | ICD-10-CM

## 2013-11-19 DIAGNOSIS — J029 Acute pharyngitis, unspecified: Secondary | ICD-10-CM

## 2013-11-19 MED ORDER — IBUPROFEN 600 MG PO TABS
ORAL_TABLET | ORAL | Status: DC
Start: 2013-11-19 — End: 2014-03-07

## 2013-11-19 NOTE — Progress Notes (Addendum)
[place] claims handlerTelephone Interpreter used. Returned call to patient. Also F/U ER  Visit on  11/16/13 for sore throat. Patient states that he has had a sore throat for over a week and is still having throat pain. Patient denies any fevers/difficulty swallowing/breathing/SOb/wheezing/chest pain or congestion or any other symptoms at this time. Patient states that he has been taking the Motrin with food as directed and is still not feeling well. Patient states having throat pain and feeling weak and tired. Patient states his throat looks red. Scheduled an appointment for this morning for follow up with Charolotte EkeLynne Crawford @ 1120. Advised to continue with Motrin as directed with food as needed for discomfort also to try warm salt water gargles as much as tolerated and to push fluids. discussed importance for follow up. Appointment confirmed with patient. Patient understands discussion/instructions and agrees to plan.         Provider Notes by Velva Harmanhomas S Holt at 11/18/2013 11:22 AM    Author: Velva Harmanhomas S Holt Service: Emergency Author Type: Physician    Filed: 11/18/2013 11:24 AM Note Time: 11/18/2013 11:22 AM Status: Signed    Editor: Velva Harmanhomas S Holt (Physician)      Expand All Collapse All   ED Provider Note  ED Course & MDM:    Pt sx are likely viral; do not suspect strep throat/PTA; pt treated with motrin.    Pt reassured re: tiny pustule on upper lip; advised to soak with warm washcloth, f/u with pcp. Impression:  Sore throat (primary encounter diagnosis)  Pustule    HPI:  This 43 year old male patient presents with chief complaint of sore throat, mild runny nose/HA for the past week. No f/c/n/v/d. No meds taken. Pt also c/o small lump to upper lip.  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    Current Outpatient Prescriptions:  ibuprofen (ADVIL,MOTRIN) 600 MG tablet  Take 1 tablet by mouth every 6 (six) hours as needed for Pain or Fever.  Disp: 30 tablet  Rfl: 0    oxycodone-acetaminophen (PERCOCET) 5-325 MG per tablet  Take 1 tablet by mouth.  TAKE 1 TABLET EVERY 4-6 HOURS AS NEEDED FOR ACUTE PAIN.  Disp: 15 tablet  Rfl: 0    hydrocodone-acetaminophen (VICODIN) 5-500 MG TABS  1 TABLET EVERY 4 TO 6 HOURS AS NEEDED  Disp: 45 tablet  Rfl: 0           SH:     Smoking status:  Never Smoker     Smokeless tobacco:  Not on file     Alcohol Use:  Yes   5.0 oz/week     3 Drinks containing 0.5 oz of alcohol, 7 Cans of beer per week          Comment:  Drank 10-20 drinks/day (beer) during weekends for 5 years; Cut back recently due to stomach pain      Allergies:   Review of Patient's Allergies indicates:    No Known Allergies         I have reviewed the ED nursing notes and prior records. I have reviewed the patient's past medical history/problem list, allergies, social history and medication list. Prior records as available electronically through the Epic record were reviewed.     Joshua Clipperhomas Seufert, MD, FACEP  Attending Physician, [place]  Clinical Instructor, [employer]  Director of Emergency Informatics, [facility]

## 2013-11-19 NOTE — Progress Notes (Signed)
[facility]    CC: sore throat for one week    HPI: Joshua Holt is a 43 year old year old male who presents to clinic today with continued throat pain  Sore throat, was to left, now to right.   No sinus congestion, no ear pain, no cough.    Seen for this in the ER 6/29---  This year pollen is bothering him  No sneezing, no itchy eyes.    What is actually bothering him is the outside of his throat more than inside.Last Tuesday while practicing martial arts, friend took Joshua Holt's shirt and pulled it against his neck externally to demonstrate a situation. He had pain across his neck when this occirred. Since this time he has had a sore throat.  No trauma inside his mouth or to throat. Is worried he needs an xray.  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 Disp: 30 tablet Rfl: 0   oxycodone-acetaminophen (PERCOCET) 5-325 MG per tablet Take 1 tablet by mouth. TAKE 1 TABLET EVERY 4-6 HOURS AS NEEDED FOR ACUTE PAIN. Disp: 15 tablet Rfl: 0   hydrocodone-acetaminophen (VICODIN) 5-500 MG TABS 1 TABLET EVERY 4 TO 6 HOURS AS NEEDED Disp: 45 tablet Rfl: 0     No current facility-administered medications on file prior to visit.     Social History Narrative    Born in [place].        No family in [place]; remain in [place].    Currently working as a Education administratorpainter        Lives with friends and  his brother        2 children in [place]; his wife has 2 more children. All 4 live in [place]        Describes difficulty living so far away from family. He misses his family greatly, and his relationship with his wife has changed a lot. They have stayed in touch for the children, but he has not seen her in 5 years.        Currently living [place] brothers in [place]        Review of Systems   Constitutional: Negative for fever, chills, malaise/fatigue and diaphoresis.   HENT: Positive for tinnitus. Negative for ear discharge, ear pain, hearing loss and nosebleeds.    Eyes: Negative for blurred vision, photophobia and discharge.   Cardiovascular: Negative for chest pain, palpitations and leg swelling.   Gastrointestinal: Negative for abdominal pain.   Genitourinary: Negative for dysuria.   Skin: Negative for rash.   Neurological: Negative for dizziness, tingling, tremors and headaches.   Endo/Heme/Allergies: Does not bruise/bleed easily.     Physical Exam   Constitutional: He is oriented  to person, place, and time.   BP 100/60   Pulse 70   Temp(Src) 98 F (36.7 C) (Oral)   Wt 131 lb 6.4 oz (59.603 kg)   SpO2 99%  Neck supple  Head neck nodes neg, tenderness at tonsillar nodes, and across thyroid  Ears neg  Sinus non tender   pharynx red, bilaterally. No exudate. Uvula midline.  S1 and S2 normal, no murmurs, clicks, gallops or rubs. Regular rate and rhythm. Chest is clear; no wheezes or rales. No edema or JVD.  No pedal edema     Neurological: He is alert and oriented to person, place, and time.         ASSESSMENT:  (462) Acute pharyngitis  (primary encounter diagnosis)  Comment:   Plan: THROAT CULTURE, BETA STREP        General symptom relief with         Increased fluid intake        Salt water  gargles        Vaporizer or steam treatments        Lozenges, or chloroseptic spray for sore throat         And over the counter treatments such as tylenol (acetaminophen), or ibuprofen              As needed.      (784.1)  Throat pain  Comment: contusion to his ext throat  Plan: ibuprofen (ADVIL,MOTRIN) 600 MG tablet        As noted, but regularly TID minimally for 7-10days,  Warm compresses.  If persists, needs to return for evaluation.    Fu prn

## 2013-11-19 NOTE — Telephone Encounter (Signed)
-----   Message from Laury DeepMary Gioiosa sent at 11/19/2013  9:08 AM EDT -----  Regarding: pain in adams apple,sore throat   Contact: [phone]  [name] is a 43 year old old male.    Patient's PCP: Lovette Clichehin Ho Fung, MD    In case we get disconnected what is the best number to reach you at today:  Mobile Phone Telephone Information:  Mobile          [phone]      Person calling:  Patient (self)    How can I help you today:   Sick Call:    What is your main complaint today?  pain in adams apple seen in [facility] 6/30 for sore throat still feeling lousy rx not working    The nurse will call you back within one hour.  She may call you at anytime. Can you remain at your telephone for the next 60 minutes?  Yes        Patient's language of care: [place]    Would you like an interpreter when the nurse calls you back?  NO

## 2013-11-19 NOTE — Progress Notes (Signed)
Patient into clinic for eval

## 2013-11-19 NOTE — Patient Instructions (Signed)
Pharyngitis (a sore throat) caused likely from a virus    Increase fluids  Salt water gargles  Lozenges  chloroseptic spray

## 2013-11-21 LAB — THROAT CULTURE BETA STREP

## 2014-03-03 ENCOUNTER — Encounter (HOSPITAL_BASED_OUTPATIENT_CLINIC_OR_DEPARTMENT_OTHER): Payer: Self-pay

## 2014-03-03 ENCOUNTER — Ambulatory Visit (HOSPITAL_BASED_OUTPATIENT_CLINIC_OR_DEPARTMENT_OTHER): Payer: PRIVATE HEALTH INSURANCE

## 2014-03-03 VITALS — BP 110/80 | HR 84 | Temp 98.5°F | Wt 130.0 lb

## 2014-03-03 DIAGNOSIS — Z23 Encounter for immunization: Secondary | ICD-10-CM

## 2014-03-03 DIAGNOSIS — G43109 Migraine with aura, not intractable, without status migrainosus: Secondary | ICD-10-CM

## 2014-03-03 DIAGNOSIS — L905 Scar conditions and fibrosis of skin: Secondary | ICD-10-CM

## 2014-03-03 DIAGNOSIS — F1011 Alcohol abuse, in remission: Secondary | ICD-10-CM

## 2014-03-03 NOTE — Progress Notes (Signed)
03/03/2014  Flu Vaccine   Ordered by Provider  VIS given  Confirmed patient's name and date of birth.  Pt denies allergies to this vaccine.   Pt denies allergies to egg or egg products.  Pt denies allergy to contact lens solution.   Pt denies adverse effects from previous administration of this medication. Pt denies history of Guillain-Barre' syndrome.  Pt denies moderate/severe illness at this time.  Risks and benefits of Flu Vaccine reviewed with pt. VIS for Flu Vaccine  offered and reviewed with pt.    Vaccine tolerated well by patient. Patient denies adverse effects from injection at this time. Patient encouraged to utilize arm and not favor it.  Patient informed may  take pain reliever of choice and to apply ice for discomfort if necessary.  Patient will call with any questions or concerns.  Please refer to Imm./Inj. section for administration site, lot # and exp. date.    Jana Markale Birdsell, LPN

## 2014-03-03 NOTE — Progress Notes (Signed)
[facility]    CC: f/u occular migraine    HPI: Joshua Holt is a 43 year old male with PMHx of heavy alcohol use and distant hx of convulsions (age 43) who presents to clinic to f/u on blurry vision. Telephone interpreter present.    1. Alcoholism: Used to drink 6-12 drinks nightly, stopped heavy drinking 1 yr ago per physician recommendation. During last appt six months ago, reports 4-5 beers over weekend, but today he reports complete cessation of alcohol for the past 30 days.     2. Blurry vision / ocular migraine: During last appt, pt reported intermittent blurry vision associated with headaches in the morning without precipitant (episodes last ~1 hour). He was evaluated by ophtho in May 2015 with normal ocular exam, most likely ddx is ocular migraines. Pt reports resolution of symptoms since then.     3. Sharp abdominal pain: midline abdominal pain few hours last night associated with bowel mov'ts. Pt reports normal stool, denies blood in stool. Denies travels outside the [place]. 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 Symptoms resolved after self massaging the belly area. Denies N/V.     4. Scar of right nasolabial fold: Pt reports severe acne many yrs ago which were treated, but suffers from large facial scar since then. Pt is requesting for plastic surgery today and willing to pay privately should insurance not cover the cost. Most bothersome is the large scar on right nasolabial fold. Pt denies facial pain.       Review of systems:   Constit: No fevers, shakes/chills, or unexplained weight loss.   HEENT: Visual blurriness resolved. No hearing changes. No sore throat or ear ache.   CV: No chest pain. No palpitations.  Pulm: No cough. No wheezing. No dyspnea.    GI: No nausea, vomiting, constipation, diarrhea. +Non-specific abdominal pain.  MSK: No new or unusual musculoskeletal symptoms. No new rashes or skin changes.   Neuro/Psych: No paresthesias or new  headaches. +alcholism in remission.   Endo: No enlarged nodes.    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    Family History:  family history includes Alcohol/Drug Abuse in his father; Diabetes in his mother.    Social History:   Social History Narrative    Born in [place]; moved to the [place] in 2006 for financial reasons.        No family in [place]; remain in [place].    Currently working as a Education administratorpainter        Lives with friends and his brother        2 children in [place]; his wife has 2 more children. All 4 live in [place]  Describes difficulty living so far away from family. He misses his family greatly, and his relationship with his wife has changed a lot. They have stayed in touch for the children, but he has not seen her in 5 years.        Currently living with his brothers in [place]        Exam  BP 110/80 mmHg   Pulse 84   Temp(Src) 98.5 F (36.9 C) (Oral)   Wt 58.968 kg (130 lb)   SpO2 97%  Pain Score: 0 (0/10)    General: 43Skinny middle age male appears older than stated age, NAD, alert, oriented   HEENT: Thick scarring of right nasolabial fold. PERRL, EOMI, anicteric, no conjunctival pallor. No oropharyngeal erythema. No tonsilar enlargement or pus.  No erythema or exudates.  Neck supple, thyroid gland not enlarged. No cervical, supraclavicular or infraclavicular  lymphadenopathy.    Heart: RRR. Normal S1/S2. No murmurs.  Lung: CTABL. Normal excursion.  Abd: Skinny, soft, non-tender, non-distended. Normoactive bowel sounds. No hepatosplenomegaly. No guarding or rebound.  Ext:  No lower extremity edema.  No rashes.  No skin ulcers.  Neuro: Moving all 4 extremities, no gross deficits. Normal gait.    Psychiatric: Affect is appropriate, mood is good. Speech rate and prosody is normal.       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 Based on his normal ocular exam, most likely ddx is ocular migraines. Pt reports resolution of blurry vision symptoms, thus no tx necessary at this time. Other ddx include alcoholism, although pt reports remission.  Plan:   - if symptoms return in the future, may consider TCA.    (F10.10) Alcohol abuse, in remission  Comment: Pt reports complete abstinence from alcohol for the past 30 days. We congratulated pt on his accomplishments.   Plan:   - continue to encourage alcohol cessation.  - consider B12, folate, homocysteine, and MMA with future labs.     (L90.5) Facial scar  Comment: Pt self requests for plastic surgery referral due to bothersome right nasolabial fold thickened scar secondary to distant hx of severe acne. Explained to pt that high out-of-pocket cost is likely. Area is non-tender, and does not look infected.  Plan:  - REFERRAL TO DERMATOLOGY ( INT). (Surgurical speciality recommended evaluation by dermatology as opposed to plastics given his specific complaint)    (Z23) Need for prophylactic vaccination and inoculation against influenza  Plan: IMMUNIZATION ADMIN SINGLE, RN, INFLUENZA VIRUS QUAD PRESV FREE VACCINE 3/> YRS IM (PUBLIC)       Health maintenance: Routine health maintenance is up to date.    Follow-up scheduled for 4 months. Pt has been advised to call if any worsening or new problems. Patient seen, examined, and  discussed with Dr. Dorene Grebeachel Stark.      Lovette Clichehin Ho Keeshawn Fakhouri MD, PGY2  Pager [phone]

## 2014-03-08 NOTE — Progress Notes (Signed)
PRECEPTOR NOTE  On the day of the patient's visit, I personally saw and evaluated the patient. In addition, I reviewed findings with the resident.  I confirm the key elements of history, physical exam, and findings as described in resident's note.  I agree with the assessment and plan as described below.  Please see resident's note for further details.

## 2014-03-17 ENCOUNTER — Ambulatory Visit (HOSPITAL_BASED_OUTPATIENT_CLINIC_OR_DEPARTMENT_OTHER): Payer: PRIVATE HEALTH INSURANCE | Admitting: Dermatology

## 2014-03-24 ENCOUNTER — Ambulatory Visit (HOSPITAL_BASED_OUTPATIENT_CLINIC_OR_DEPARTMENT_OTHER): Payer: PRIVATE HEALTH INSURANCE | Admitting: Dermatology

## 2014-03-24 DIAGNOSIS — L905 Scar conditions and fibrosis of skin: Secondary | ICD-10-CM

## 2014-03-24 NOTE — Patient Instructions (Signed)
Selsun Blue shampoo 

## 2014-03-24 NOTE — Progress Notes (Signed)
Joshua Holt is a 43 year old male patient.    HPI:   Deitra MayoJose Carlos Tucker is a very pleasant 43 year old male who presents for evaluation of scars on the face. He has had them since he was teenager (age 43 or [age]) and thinks they were caused by acne surgery. He had two large furuncles or acne cysts on the both cheeks and they were I&D'd when he was a teenager. He thinks that procedure left scars. The scars have been stable since then but have become more noticeable as he has gotten older and has lost weight in his face. He has recently lost a lot of weight due to anxiety but has been gaining it back. They are bumpy and they bother him cosmetically. He wants to have plastic surgery. He has no scars or nodules elsewhere on the body.    Also reports a rash on the back that recurs in the summer. He has used prescription meds in the past but has run out. It is quiescent currently. It is red and scaly.    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  No current facility-administered medications for this visit.    Allergies:  Review of Patient's Allergies indicates:  No Known Allergies    Hospital Problem List:  Active Problems:    * No active hospital problems. *      Vitals:  Extended Vitals not filed for this encounter.    Physical Exam  WDWN, NAD, A&Ox3  The scalp, head, eyelids, conjunctiva, lips, neck, chest, abdomen, back, upper extremities, and nails were examined with the following findings:  1) On the face are two linear nodular scars along the nasolabial folds.   - No nodularity elsewhere on the face. No leonine facies. No madarosis.    2) On the upper and mid back are  clustered hypopigmented oval macules without scale      Assessment:  Very pleasant 43 year old male who presents for evaluation of scars   1) Hypertrophic scars - Would like Plastic Surgery opinion on these. They are strangely linear for a procedure that sounded like an I&D. Given that they are associated with the border of a cosmetic unit (nasolabial folds), and given their size and depth  I don't think these would be amenable to intralesional kenalog.   - Initially I considered an infiltrative disease like leprosy/Hansen's disease but he has no nodules over the eyebrows, no leonine facies or madarosis. He also swears the scars have been present and stable since age 43 when he had the "acne surgery." On upper body skin exam, he does have hypopigmented macules on the back but these appear more consistent with tinea versicolor than Hansen's disease.   - If after Plastics evaluation, a biopsy is recommend, I am happy to do a punch biopsy to prove the diagnosis.     2) Tinea versicolor - selenium sulfide 1% shampoo as a body wash three times a week for 6 months.    Rona RavensSunaina Rishab Stoudt, MD  03/24/2014

## 2014-03-24 NOTE — Progress Notes (Deleted)
Joshua Holt is a 43 year old male patient.    HPI:   Joshua Holt is a very pleasant 43 year old male who presents for evaluation of a lesion of concern.  [facility] to [facility] lesion has been present for approximately ***.  Lesion is located on the *** Lesion has/has not been changing in size.  Lesion has/has not been changing in quality. Lesion has/has not been pruritic. Lesion has/has not been painful. Lesion has/has not been bleeding.    No other dermatologic complaints or concerns today.    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  No current facility-administered medications for this visit.    Allergies:  Review of Patient's Allergies indicates:  No Known Allergies    Hospital Problem List:  Active Problems:    * No active hospital problems. *      Vitals:  Extended Vitals not filed for this encounter.    Physical Exam  WDWN, NAD, A&Ox3  The scalp, head, eyelids, conjunctiva, lips, neck, chest, abdomen, back, upper and lower extremities, groin, and nails.       Assessment:  Very pleasant 43 year old male who presents for evaluation of a lesion of concern    The etiology of this lesion is uncertain.     Overall clinical presentation and history is suggestive of       For now I recommended:          Rona RavensSunaina Jem Castro, MD  03/24/2014

## 2014-03-24 NOTE — Progress Notes (Signed)
Patient feels safe at home.

## 2014-04-29 ENCOUNTER — Encounter (HOSPITAL_BASED_OUTPATIENT_CLINIC_OR_DEPARTMENT_OTHER): Payer: Self-pay

## 2014-04-29 ENCOUNTER — Ambulatory Visit (HOSPITAL_BASED_OUTPATIENT_CLINIC_OR_DEPARTMENT_OTHER): Payer: PRIVATE HEALTH INSURANCE

## 2014-04-29 DIAGNOSIS — L905 Scar conditions and fibrosis of skin: Secondary | ICD-10-CM

## 2014-04-29 NOTE — Progress Notes (Signed)
Joshua Holt comes in today due to some scars on his face that he finds unsightly.  He had some acne cysts that were I & D'd in the past and now he has scars here.  He does not like the way that they look.    He had one area of raised scar next to his right N-L fold.  This could be excised and re-closed but it may happen again.  He also has surrounding acne pitting that he does not like.  I think that it would be best if he saw a dermatologist for laser resurfacing.      I gave him the name of a few dermatologists that use lasers.

## 2014-04-30 ENCOUNTER — Encounter (HOSPITAL_BASED_OUTPATIENT_CLINIC_OR_DEPARTMENT_OTHER): Payer: Self-pay

## 2014-04-30 ENCOUNTER — Emergency Department (HOSPITAL_BASED_OUTPATIENT_CLINIC_OR_DEPARTMENT_OTHER)
Admission: RE | Admit: 2014-04-30 | Disposition: A | Discharge status: Home / Self Care | Payer: Self-pay | Source: Emergency Department | Attending: Emergency Medicine | Admitting: Emergency Medicine

## 2014-04-30 LAB — CBC, PLATELET & DIFFERENTIAL
ABSOLUTE BASO COUNT: 0 10*3/uL (ref 0.0–0.1)
ABSOLUTE EOSINOPHIL COUNT: 0.1 10*3/uL (ref 0.0–0.8)
ABSOLUTE IMM GRAN COUNT: 0.03 10*3/uL (ref 0.00–0.03)
ABSOLUTE LYMPH COUNT: 2.2 10*3/uL (ref 0.6–5.9)
ABSOLUTE MONO COUNT: 0.5 10*3/uL (ref 0.2–1.4)
ABSOLUTE NEUTROPHIL COUNT: 3.8 10*3/uL (ref 1.6–8.3)
BASOPHIL %: 0.6 % (ref 0.0–1.2)
EOSINOPHIL %: 1.9 % (ref 0.0–7.0)
HEMATOCRIT: 48.2 % (ref 40.1–51.0)
HEMOGLOBIN: 16.6 g/dL (ref 13.7–17.5)
IMMATURE GRANULOCYTE %: 0.4 % (ref 0.0–0.4)
LYMPHOCYTE %: 32.8 % (ref 15.0–54.0)
MEAN CORP HGB CONC: 34.4 g/dL (ref 31.0–37.0)
MEAN CORPUSCULAR HGB: 28.5 pg (ref 26.0–34.0)
MEAN CORPUSCULAR VOL: 82.7 fL (ref 80.0–100.0)
MEAN PLATELET VOLUME: 11.2 fL (ref 8.7–12.5)
MONOCYTE %: 6.9 % (ref 4.0–13.0)
NEUTROPHIL %: 57.4 % (ref 40.0–75.0)
PLATELET COUNT: 231 10*3/uL (ref 150–400)
RBC DISTRIBUTION WIDTH STD DEV: 38.7 fL (ref 35.1–46.3)
RBC DISTRIBUTION WIDTH: 12.9 % (ref 11.5–14.3)
RED BLOOD CELL COUNT: 5.83 M/uL (ref 4.60–6.10)
WHITE BLOOD CELL COUNT: 6.7 10*3/uL (ref 4.0–11.0)

## 2014-04-30 LAB — SERUM DRUG SCREEN 6 DRUGS
ACETAMINOPHEN: <2 ug/mL — ABNORMAL LOW (ref 10–30)
BARBITURATE: NEGATIVE
BENZODIAZEPINE: NEGATIVE
ETHANOL: <3 mg/dL (ref 0–3)
SALICYLATE: <2.8 mg/dL — ABNORMAL LOW (ref 2.8–20.0)
TRICYCLIC ANTIDEPRESSANTS: NEGATIVE

## 2014-04-30 LAB — COMPREHENSIVE METABOLIC PANEL
ALANINE AMINOTRANSFERASE: 58 U/L — ABNORMAL HIGH (ref 12–45)
ALBUMIN: 4.2 g/dL (ref 3.4–5.0)
ALKALINE PHOSPHATASE: 63 U/L (ref 45–117)
ANION GAP: 6 mmol/L (ref 5–15)
ASPARTATE AMINOTRANSFERASE: 30 U/L (ref 8–34)
BILIRUBIN TOTAL: 0.4 mg/dL (ref 0.2–1.0)
BUN (UREA NITROGEN): 12 mg/dL (ref 7–18)
CALCIUM: 9.8 mg/dL (ref 8.5–10.1)
CARBON DIOXIDE: 31 mmol/L (ref 21–32)
CHLORIDE: 104 mmol/L (ref 98–107)
CREATININE: 0.9 mg/dL (ref 0.7–1.2)
ESTIMATED GLOMERULAR FILT RATE: >60 mL/min (ref >60)
Glucose Random: 107 mg/dL (ref 74–160)
POTASSIUM: 4.1 mmol/L (ref 3.5–5.1)
SODIUM: 141 mmol/L (ref 136–145)
TOTAL PROTEIN: 7.2 g/dL (ref 6.4–8.2)

## 2014-04-30 LAB — TSH (THYROID STIMULATING HORMONE): TSH (THYROID STIM HORMONE): 1.55 u[IU]/mL (ref 0.358–3.740)

## 2014-04-30 LAB — HOLD BLUE TOP TUBE

## 2014-04-30 LAB — TROPONIN I: TROPONIN I: <0.02 ng/mL (ref 0.00–0.04)

## 2014-04-30 LAB — LIPASE: LIPASE: 89 U/L (ref 73–393)

## 2014-04-30 LAB — XR CHEST 2 VIEWS

## 2014-04-30 MED ORDER — CALCIUM CARBONATE ANTACID 500 MG PO CHEW
500.00 mg | CHEWABLE_TABLET | Freq: Once | ORAL | Status: AC
Start: 2014-04-30 — End: 2014-04-30
  Administered 2014-04-30: 500 mg via ORAL
  Filled 2014-04-30: qty 1

## 2014-04-30 MED ORDER — FAMOTIDINE 20 MG/2ML IV SOLN
20.00 mg | Freq: Once | INTRAVENOUS | Status: AC
Start: 2014-04-30 — End: 2014-04-30
  Administered 2014-04-30: 20 mg via INTRAVENOUS
  Filled 2014-04-30: qty 2

## 2014-04-30 MED ORDER — LIDOCAINE VISCOUS 2 % MT SOLN
5.00 mL | Freq: Once | OROMUCOSAL | Status: AC
Start: 2014-04-30 — End: 2014-04-30
  Administered 2014-04-30: 5 mL via OROMUCOSAL
  Filled 2014-04-30: qty 15

## 2014-04-30 MED ORDER — SODIUM CHLORIDE 0.9 % IV BOLUS
1000.00 mL | Freq: Once | INTRAVENOUS | Status: AC
Start: 2014-04-30 — End: 2014-04-30
  Administered 2014-04-30: 1000 mL via INTRAVENOUS

## 2014-04-30 MED ORDER — ALUMINUM-MAGNESIUM-SIMETHICONE 200-200-20 MG/5ML PO SUSP
30.00 mL | Freq: Once | ORAL | Status: AC
Start: 2014-04-30 — End: 2014-04-30
  Administered 2014-04-30: 30 mL via ORAL
  Filled 2014-04-30: qty 30

## 2014-04-30 MED ORDER — OMEPRAZOLE 40 MG PO CPDR
40.00 mg | DELAYED_RELEASE_CAPSULE | Freq: Every morning | ORAL | Status: AC
Start: 2014-04-30 — End: 2014-05-30

## 2014-04-30 MED ORDER — RANITIDINE HCL 150 MG PO TABS
150.00 mg | ORAL_TABLET | Freq: Two times a day (BID) | ORAL | Status: AC
Start: 2014-04-30 — End: 2014-05-30

## 2014-04-30 MED ORDER — ACETAMINOPHEN 325 MG PO TABS
975.00 mg | ORAL_TABLET | Freq: Once | ORAL | Status: AC
Start: 2014-04-30 — End: 2014-04-30
  Administered 2014-04-30: 975 mg via ORAL
  Filled 2014-04-30: qty 3

## 2014-04-30 MED ORDER — KETOROLAC TROMETHAMINE 30 MG/ML INJ
30.00 mg | Freq: Once | Status: AC
Start: 2014-04-30 — End: 2014-04-30
  Administered 2014-04-30: 30 mg via INTRAVENOUS
  Filled 2014-04-30: qty 1

## 2014-04-30 NOTE — Narrator Note (Signed)
Monitor remains sinus rhythm.  Patient feeling a little better.  Still pain free

## 2014-04-30 NOTE — Narrator Note (Signed)
IV established.  Patient medicated as ordered.

## 2014-04-30 NOTE — ED Triage Note (Signed)
Patient did not feel well yesterday, had a headache and pains in his stomach.  Last night around 10 pm he states that he passed out.  This morning he states that he has no pain but his heart feels "funny".

## 2014-04-30 NOTE — Narrator Note (Signed)
Patient Disposition    Patient education for diagnosis, medications, activity, diet and follow-up.  Patient left ED 1:55 PM.  Patient rep received written instructions.  Interpreter to provide instructions: Yes    Patient belongings with patient: YES    Have all existing LDAs been addressed? Yes    Have all IV infusions been stopped? Yes    Discharged to: Discharged to home

## 2014-04-30 NOTE — Discharge Instructions (Signed)
REVIEWED WITH Spanish INTERPRETER/TRANSLATED WITH GOOGLE TRANSLATE    What we did in the Emergency Department (ED):  - blood testing:  Normal kidney, liver and pancreas function  - urine testing: no infection    - Electrocardiogram: normal    - medicine to treat suspected stomach pain from too much acid in stomach: Mylanta, TUMS, viscous lidocaine, famotidine (Pepcid)    It is very likely that you fainted due to not having enough fluid in your body by the time you got up to pee.    Next steps:    - START taking ranitidine (ZANTAC) 150 mg twice a day to reduce the amount of acid in your stomach. Zantac is just like Pepcid, one of the medicines you were given in the ED at this visit.     ZANTAC is available over the counter -- buy whichever version costs less (prescription with your co-pay or over the counter)    - START taking omeprazole (PRILOSEC) 40 mg before breakfast to reduce the amount of acid in your stomach.      PRILOSEC is also available over the counter -- buy whichever version costs less (prescription with your co-pay or over the counter)    - AVOID NSAID pain medications like ibuprofen (MOTRIN/ADVIL) and naproxen (NAPROSYN/ALEVE).  - it is ok to use the pain medication acetaminophen = paracetamol (TYLENOL), which is gently on the stomach. HOWEVER, acetaminophen can hurt the liver if you take too much -- never take more than three(3) doses of acetaminophen in one day.    - AVOID the following spicy/acidic kinds of foods:  - alcohol  - caffeine  - lemons, lemonade, orange juice  - tomatoes, tomato sauce  - pizza, fried foods, french fries  - curry, pepper, other spices      Come back to the Emergency Department (ED) for:  Vomiting blood, new/worse pain - especially with fever; chest pain, can't breathe, shortness of breath.      Thank you for your patience.  ---  REVISADO CON INTRPRETE espaol / TRADUCIDO CON traductor Google    Lo que hicimos en el Tax inspectorDepartamento de Emergencia (ED):  - Anlisis de sangre:  la funcin del rin, el hgado y el pncreas normal  - Anlisis de orina: no hay infeccin    - Electrocardiograma: normales    - Medicamentos para tratar sospecha de dolor de estmago del exceso de cido en el estmago: Mylanta, TUMS, lidocana viscosa, famotidina (Pepcid)    Es muy probable que se desmay debido a no tener suficiente lquido en su cuerpo en el momento en que se levant a Geographical information systems officerorinar.    Prximos pasos:    - Empezar a tomar ranitidina (Zantac) 150 mg dos veces al da para reducir la cantidad de Pharmacologistcido en el estmago. Zantac es Federated Department Storescomo Pepcid, uno de los medicamentos que le dieron en el servicio de urgencias en esta visita.  ZANTAC est disponible sin receta - comprar la versin que cuesta menos (receta con su co-pago o de venta libre)    - Empezar a tomar omeprazol (Prilosec) 40 mg antes del desayuno para reducir la cantidad de Pharmacologistcido en el estmago.  PRILOSEC tambin est disponible sin receta - comprar la versin que cuesta menos (receta con su co-pago o de venta libre)    - EVITAR analgsicos AINE como el ibuprofeno (Motrin / Advil) y naproxeno (NAPROSYN / Aleve).  - Que es buena idea usar el medicamento para el dolor acetaminofn = paracetamol (Tylenol), que es [place]  en el estmago. SIN EMBARGO, el paracetamol puede daar el hgado si se toma demasiado - nunca tome ms de tres (3) dosis de Agricultural engineeracetaminofn en un da.    - Evite los siguientes tipos picantes / cidos de los alimentos:  - alcohol  - cafena  - Limones, limonada, jugo de naranja  - [address]os tomates, salsa de [place]tomate  - [place] WellsPizza, alimentos fritos, patatas fritas  - Lampeterurry, la pimienta, otras especias      Vuelve al Cox CommunicationsDepartamento de Emergencia (ED) para: Vmitos de Andoversangre, Technical sales engineernuevo peor dolor / - especialmente con fiebre; Kohl'sdolor en el pecho, no puede respirar, Erath Scientificfalta de aliento.      Gracias por tu paciencia.

## 2014-04-30 NOTE — ED Provider Notes (Signed)
The patient was seen primarily by me. ED nursing record was reviewed. Select prior records as available electronically through the Epic record were reviewed.    History, physical exam and disposition were conducted with an official hospital [place] interpreter.    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  Walked home and snacked on some knots and drank some wine with his brother and roommates.  Spent the rest of the afternoon and evening in bed.  Did not eat dinner.  Went to bed at 20/1/30.  Got up again at 2300 to void.  While voiding felts dizzy/lightheadedness without vertigo, felt palpitations, started to pull up his hands after completion of voiding and then "woke up" on the bathroom floor.    Denies having any headache.  No neck pain.  Does not recall having felt any chest pain that right now states that he still feels a little bit of that chest pressure that had started at 1430 yesterday.  States that this pressure/tightness has been constant.  Nonradiating.  Does not go to the back.  No back pain.  No flank pain.    No dysuria/urgency/frequency.    ROS: Pertinent positives were reviewed as per the HPI above. All other systems were reviewed and are negative.  Doristine Mango  Language of care: [place]  MRN: [id number]  PCP: Lovette Cliche, MD, MD  Mode of arrival to ED: Self.  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     Patient Active Problem List:     Alcohol abuse     Dyspepsia and other specified disorders of function of stomach     Unspecified hemorrhoids without mention of complication     History of convulsions     Ocular migraine     Alcohol abuse, in remission    Past Surgical History:       Past Surgical History    NO SIGNIFICANT SURGICAL HISTORY         Family History: no history of early CAD or MI or sudden death    Social History:     Social History   Marital Status: Single  Spouse Name: N/A    Years of Education: N/A  Number of Children: N/A     Occupational History  None on file     Social History Main Topics   Smoking status: Never Smoker     Smokeless tobacco: Not on file    Alcohol Use: Yes  5.0 oz/week    3 Not specified, 7 Cans of beer per week         Comment: Drank 10-20 drinks/day (beer) during weekends for 5 years; Cut back recently due to stomach pain    Drug Use: No    Sexual Activity: Not Currently    Partners: Female  Comment: last partner 3 months ago; no condom use; STD testing 2 months     Other Topics Concern   None on file     Social History Narrative    Born in [place]; moved [place] the [place] in 2006 for financial reasons.    [place]  Moved to the [place] area in [place].        No family in [place]; remain in [place].    Currently working as a Education administratorpainter        Lives with friends and his brother        2 children in [place]; his wife has 2 more children. All 4 live in [place]        Describes difficulty living so far away from family. He misses his family greatly, and his relationship with his wife has changed a lot. They have stayed in touch for the children, but he has not seen her in 5 years.        Currently living with his brothers in [place]        04/2014 - still working as a Education administratorpainter; living with brother and other roommates.      Allergies: Review of Patient's Allergies indicates:  No Known  Allergies    Immunizations:   Immunization History   Administered Date(s) Administered    Flu Vaccine > 3 Yrs 03/27/2012    Influenza Virus Quad Presv Free Vacc 3/> Yrs IM 03/03/2014          Medications:  None     Physical Exam:   Patient Vitals for the past 24 hrs:   BP Temp Pulse Resp SpO2 Weight   04/30/14 0933 119/65 mmHg 98.1 F 99 18 100 % 60 kg (132 lb 4.4 oz)     GENERAL:  WDWN, no acute distress, non-toxic   SKIN:  Warm & Dry, no rash, no petechia.  HEAD:  NCAT. Sclerae are anicteric and aninjected, oropharynx is clear with slightly dry mucous membranes. PERRL. EOMI. B TMs clear without hemotympani.  NECK:  No C-spine tenderness, crepitus, step-off.  No LAN. No stridor. No bruits.  LUNGS:  Clear to auscultation bilaterally. No wheezes, rales, rhonchi.   HEART:  RRR.  No murmurs, rubs, or gallops.   ABDOMEN:  Soft, ND. TTP epigastrium. No masses.  No involuntary guarding or rebound.   EXTREMITIES:  No obvious deformities.  Warm and well perfused.  No cyanosis, no edema. Negative Homans B.  GENITOURINARY:  No CVA tenderness B.  NEUROLOGIC:  Alert and oriented; moves all extremities well; speaking in clear fluent sentences. Normal gait without ataxia. CNsII-XII symmetrical and intact. Sensation intact to light touch throughout. 5/5 strength globally. Negative SLR B.  PSYCHIATRIC:  Appropriate for age, time of day, and situation    Medications Given in the ED:  Medications   sodium chloride 0.9 % IV bolus 1,000 mL (0 mLs Intravenous Stopped 04/30/14 1108)   lidocaine (XYLOCAINE) 2 % viscous solution 5 mL (5 mLs Mouth/Throat Given 04/30/14 1023)   calcium carbonate (TUMS) chewable tablet 500 mg (500 mg Oral Given 04/30/14 1022)   famotidine (PEPCID) injection 20 mg (20 mg Intravenous Given 04/30/14 1023)   aluminum-magnesium hydroxide-simethicone (MYLANTA II) 200-200-20 MG/5ML suspension 30 mL (30 mLs Oral Given 04/30/14 1022)   acetaminophen (TYLENOL) tablet 975 mg (975 mg Oral Given 04/30/14 1022)    ketorolac (TORADOL) injection 30 mg (30 mg Intravenous Given 04/30/14 1023)    Radiology Results:  -  CXR: no acute cardiopulmonary pathology   Lab Results (abnormal results only):  Labs Reviewed   COMPREHENSIVE METABOLIC PANEL - Abnormal; Notable for the following:     ALANINE AMINOTRANSFERASE 58 (*)     All other components within normal limits    Narrative:     Tests added: SDS by MLB on 04/30/14 at 1138 by [email or web address].   SERUM DRUG SCREEN 6 DRUGS - Abnormal; Notable for the following:     SALICYLATE < 2.8 (*)     ACETAMINOPHEN < 2 (*)     All other components within normal limits    Narrative:     Tests added: SDS by MLB on 04/30/14 at 1138 by [email or web address].   CBC + PLT + AUTO DIFF   LIPASE    Narrative:     Tests added: SDS by MLB on 04/30/14 at 1138 by LM34.   TSH (THYROID STIMULATING HORMONE)    Narrative:     Tests added: SDS by MLB on 04/30/14 at 1138 by LM34.   TROPONIN I   HOLD BLUE TOP TUBE   LAB ADD ON/WRITE IN TEST    Other Results (e.g. ECG):  - ECG on my reading: NSR, nl axis/intervals, no evidence of chamber enlargement or ischemia.     ED Course and Medical Decision-making:  43 year old male patient with likely micturition syncope in the setting of decreased PO intake and some ethanol ingestion yesterday.  Lower chest/epigastric pressure likely GERD.    ECG without acute changes.    Given IVF. Given IV ketorolac, PO APAP, "GI cocktail" of IV famotidine (Pepcid), PO viscous lidocaine, Mylanta and TUMS.    Observed for several hours; no ectopy on monitor.    Resolution of sxs. Discharged to home and advised to decrease EtOH intake.    Patient/family educated on diagnosis(es); he states understanding and agrees with plan of care.  Reasons to return to the ED were reviewed in detail. He agrees with this plan and disposition.    Condition on Discharge: Improved and Stable    Diagnosis/Diagnoses:  Micturition syncope  (primary encounter diagnosis)  Epigastric abdominal pain  Acute gastritis without  hemorrhage    Discharge Prescriptions:  Discharge Medication List as of 04/30/2014  1:41 PM    START taking these medications    ranitidine (ZANTAC) 150 MG tablet  Take 1 tablet by mouth 2 (two) times daily.  Tamperproof, Disp-60 tablet, R-0    omeprazole (PRILOSEC) 40 MG capsule  Take 1 capsule by mouth every morning. Take before the morning meal  Tamperproof, Disp-30 capsule, R-0      Modene Andy Lai_Becker MD FACEP    This Emergency Department patient encounter note was created using voice-recognition software and in real time during the ED visit.

## 2014-04-30 NOTE — Narrator Note (Signed)
Patient educated on the effects and potential side effects of Zantac and Prilosec.  Patient educated on cardiac symptoms.  Patient discharged to home ambulatory

## 2014-05-06 LAB — EKG

## 2014-07-01 ENCOUNTER — Telehealth (HOSPITAL_BASED_OUTPATIENT_CLINIC_OR_DEPARTMENT_OTHER): Payer: Self-pay

## 2014-07-01 ENCOUNTER — Ambulatory Visit (HOSPITAL_BASED_OUTPATIENT_CLINIC_OR_DEPARTMENT_OTHER): Payer: Medicaid Other

## 2014-07-01 ENCOUNTER — Encounter (HOSPITAL_BASED_OUTPATIENT_CLINIC_OR_DEPARTMENT_OTHER): Payer: Self-pay

## 2014-07-01 VITALS — BP 122/88 | HR 106 | Temp 97.6°F | Wt 138.0 lb

## 2014-07-01 DIAGNOSIS — L700 Acne vulgaris: Secondary | ICD-10-CM

## 2014-07-01 DIAGNOSIS — F1010 Alcohol abuse, uncomplicated: Secondary | ICD-10-CM

## 2014-07-01 DIAGNOSIS — B356 Tinea cruris: Secondary | ICD-10-CM

## 2014-07-01 MED ORDER — SELENIUM SULFIDE 2.5 % EX LOTN
TOPICAL_LOTION | Freq: Every evening | CUTANEOUS | Status: AC
Start: 2014-07-01 — End: 2014-07-15

## 2014-07-01 MED ORDER — TRETINOIN 0.025 % EX CREA
TOPICAL_CREAM | Freq: Every evening | CUTANEOUS | Status: AC
Start: 2014-07-01 — End: 2014-08-30

## 2014-07-01 MED ORDER — KETOCONAZOLE 2 % EX CREA
TOPICAL_CREAM | Freq: Every day | CUTANEOUS | Status: AC
Start: 2014-07-01 — End: 2014-07-31

## 2014-07-01 NOTE — Progress Notes (Signed)
Primary Care    CC: f/u facial rash, alcoholism    HPI: Joshua Holt is a 44 year old male with PMhx of alcoholism who presents to clinic for f/u:    1. Scar of right nasolabial fold. During prior appt, pt complained of facial scar on right nasolabial fold presumed 2/2 prior acne cyst that were I&D. Pt wanted tx despite high out-of-pocket costs. Thus he was referred to both dermatology and plastic surgery, but was then recommended more specialized dermatologist and plastic surgeons at [facility] for potential laser resurfacing. Pt is awaiting his appt later in February.     2. Facial rash: hx of recurrent red bumps on his face, but reports more prominent symptoms with increased pain on these bumps over the past 6 month. He noted a particular bothersome region on left lower face that is present X 30 days. Reports similar rash/bumps on his upper back. Pt tried selenium sulfide shampoo without significant improvement.     3. Genital rash: reports itch genital rash on suprapubic area and upper thighs that started in recent months. Denies sexual activity. No dysuria.    4. Alcoholism: Hx of heavy alcohol use although never considered himself an alcoholic. During last appt, reports sobreity but has now returned to using up to 12 beers daily. No hx of alcohol withdrawal sz. Reports multiple life stressors, but most notable for 25 y/o son in [place] who is using street drugs. Not interested in AA, naltrexone, or psychotherapy.     Review of systems:   Constit: No fevers, shakes/chills, or unexplained weight loss.   HEENT: No visual changes. No hearing changes. No sore throat or ear ache.   CV: No chest papitations.  Pulm: No cough. No wheezing. No dysin. No palpnea.    GI: No nausea, vomiting, constipation, diarrhea. No abdominal pain.  MSK: No new or unusual musculoskeletal symptoms. +facial, upper back, and genital rash.   Neuro/Psych: No paresthesias or new headaches. +alcoholism.   Endo: No  enlarged nodes.    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 To areas of rash. Disp: 118 mL Rfl: 1     No current facility-administered medications on file prior to visit.    Family History:  family history includes Alcohol/Drug Abuse in his father; Diabetes in his mother.    Social History:   Social History Narrative    Born in [place]; moved to the [place] in 2006 for financial reasons.    First moved to [place] to [place].  Moved to the [place] area in 2009.        No family in [place]; remain in [place].    Currently working as a Education administrator        Lives with friends and his brother  2 children in [place]; his wife has 2 more children. All 4 live in [place]        Describes difficulty living so far away from family. He misses his family greatly, and his relationship with his wife has changed a lot. They have stayed in touch for the children, but he has not seen her in 5 years.        Currently living with his brothers in [place]        04/2014 - still working as a Education administratorpainter; living with brother and other roommates.        Exam  BP 122/88 mmHg   Pulse 106   Temp(Src) 97.6 F (36.4 C) (Oral)   Wt 62.596 kg (138 lb)   SpO2 95%  Pain Score: Data Unavailable    General: Pleasant skinny middle age male, NAD, alert, oriented   HEENT: Thick scarring of right nasolabial fold. Moderate acne vulgaris noted on face, neck, and upper back. A large  papule noted on left lower law that was tender to palpation.  No tonsilar enlargement or pus. Neck supple, thyroid gland not enlarged. No cervical, supraclavicular or infraclavicular lymphadenopathy.    Heart: RRR. Normal S1/S2. No murmurs.  Lung: CTABL. Normal excursion.  Abd: soft, non-tender, non-distended. Normoactive bowel sounds. No hepatosplenomegaly. No guarding or rebound.  Ext:  No lower extremity edema.  No rashes.  No skin ulcers.  GU: normal appearing circumcised male external genitalia.  Bilaterally descended testicles.  Diffuse red maculopapule rash noted on suprapubic and upper thighs region. No abnl discharge.     Neuro: Moving all 4 extremities, no gross deficits.   Psychiatric: Non-intoxicated appearance. Affect is appropriate, mood is upset when talking about his life stressors. Speech rate and prosody is normal.      ASSESMENT/PLAN:  (F10.10) Alcohol abuse  (primary encounter diagnosis)  Comment: Pt is in denial of his alcohol use and justifying his heavy consumption of beer by stating he does not drink hard liquor. Appears to have restarted alcohol use due to life stressors as he recently found out his son is using street drugs. No SI. We reviewed options including AA, psychotherapy, and meds (ie Naltrexone) which he deferred.   Plan:   - needs close f/u in 4-6 weeks.  - re-discuss psychotherapy and naltrexone during next appt.     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 There may be additional component of ingrown hair and folliculitis causing his pain, but no obvious signs of infection today, thus will hold off abx. Will start topical retinoid.   Plan:  - tretinoin (RETIN-A) 0.025 % cream, use nightly  - encouraged facial hygiene    (B35.6) Tinea cruris  Comment: Itchy rash on suprapubic and upper thighs most consistent with fungal genital infection. No risky sexual behavior. Will give anti-fungal  cream.  Plan:   - selenium sulfide (SELSUN) 2.5 % lotion, ketoconazole (NIZORAL) 2 % cream      We discussed the patients current medications. The patient expressed understanding and no barriers to adherence were identified.     Health maintenance: Routine health maintenance is up to date.    Follow-up scheduled for 4-6 weeks. Pt has been advised to call if any worsening or new problems. Patient seen, examined, and discussed with Dr. Etta Quillanny McCormick.       Lovette Clichehin Ho Alexica Schlossberg MD, PGY2  Pager [phone]

## 2014-07-01 NOTE — Progress Notes (Signed)
Prior authorization request for TRETINOIN 0.025% CREAM   Clinical Indication: ance vulgaris, moderate-to-severe  Previous medications tried: selenium sulfate, facial wash  Diagnostic testing information: Moderate-to-severe facial acne with scarring.   Patient has HEALTH SAFETY NET for prescription coverage.   I.D.#: [id number]

## 2014-07-01 NOTE — Progress Notes (Signed)
TRETINOIN 0.025% CREAM requires prior authorization through the patients insurance. Please fill out the following information below and send back to the [employer] [facility] .       Prior authorization request for TRETINOIN 0.025% CREAM  Clinical Indication:   Previous medications tried:   Diagnostic testing information:     Patient has HEALTH SAFETY NET for prescription coverage.  I.D.#: [id number]

## 2014-07-01 NOTE — Progress Notes (Signed)
PRIOR AUTHORIZATION FOR Tretinoin 0.025% Cream    Patient has Health Safety Net for prescription coverage.  I.D.#: [id number]    Filled out General Drug Prior Authorization Request form. Scanned form into Epic under Careers information officerMedia Manager (PA for Tretinoin).    Please print out form to be reviewed, signed, and faxed to insurance company.    Once the form has been signed and faxed, please scan signed form into Careers information officerMedia Manager

## 2014-07-05 NOTE — Progress Notes (Signed)
PRIOR AUTHORIZATION FOR Tretinoin 0.025% cream has been approved     Start Date: 07/05/2014  End Date: 07/06/2015    PA #: [id number]    Approval notice has been scanned into Careers information officermedia manager.       Patient has Health Safety Net for prescription coverage.  I.D.# [id number]

## 2014-07-10 NOTE — Progress Notes (Signed)
PRECEPTOR NOTE  On the day of the patient's visit, I personally saw and evaluated the patient. In addition, I reviewed findings with the resident. I confirm the key elements of history and physical exam as described in resident's note.  I agree with the assessment and plan as described below.  Please see resident's note for further details.

## 2014-08-09 ENCOUNTER — Ambulatory Visit (HOSPITAL_BASED_OUTPATIENT_CLINIC_OR_DEPARTMENT_OTHER): Payer: Medicaid Other

## 2015-09-30 ENCOUNTER — Encounter (HOSPITAL_BASED_OUTPATIENT_CLINIC_OR_DEPARTMENT_OTHER): Payer: Self-pay

## 2015-09-30 ENCOUNTER — Emergency Department (HOSPITAL_BASED_OUTPATIENT_CLINIC_OR_DEPARTMENT_OTHER)
Admission: RE | Admit: 2015-09-30 | Disposition: A | Discharge status: Home / Self Care | Payer: Self-pay | Source: Emergency Department | Attending: Emergency Medicine | Admitting: Emergency Medicine

## 2015-09-30 LAB — XR TIBIA FIBULA LEFT 2 VIEWS

## 2015-09-30 LAB — XR CHEST 2 VIEWS
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
## 2015-09-30 NOTE — Narrator Note (Signed)
Patient Disposition    Patient education for diagnosis, medications, activity, diet and follow-up.  Patient left ED 9:44 AM.  Patient rep received written instructions.  Interpreter to provide instructions: No    Patient belongings with patient: YES    Have all existing LDAs been addressed? N/A    Have all IV infusions been stopped? N/A    Discharged to: Discharged to home. Teaching done on home care and follow up
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
## 2019-08-14 ENCOUNTER — Telehealth (HOSPITAL_BASED_OUTPATIENT_CLINIC_OR_DEPARTMENT_OTHER): Payer: Self-pay | Admitting: Family Medicine

## 2019-08-14 ENCOUNTER — Ambulatory Visit
Admission: RE | Admit: 2019-08-14 | Discharge: 2019-08-14 | Disposition: A | Discharge status: Home / Self Care | Payer: 344 | Attending: Family Medicine | Admitting: Family Medicine

## 2019-08-14 DIAGNOSIS — Z20822 Contact with and (suspected) exposure to covid-19: Secondary | ICD-10-CM

## 2019-08-14 LAB — COVID-19 OUTPATIENT: COVID-19 OUTPATIENT: NEGATIVE

## 2019-08-14 NOTE — Telephone Encounter (Signed)
covid

## 2019-10-23 ENCOUNTER — Emergency Department
Admission: EM | Admit: 2019-10-23 | Discharge: 2019-10-24 | Disposition: A | Discharge status: Home / Self Care | Payer: No Typology Code available for payment source | Source: Intra-hospital | Attending: Physician Assistant | Admitting: Physician Assistant

## 2019-10-23 ENCOUNTER — Encounter (HOSPITAL_BASED_OUTPATIENT_CLINIC_OR_DEPARTMENT_OTHER): Payer: Self-pay

## 2019-10-23 ENCOUNTER — Emergency Department (HOSPITAL_BASED_OUTPATIENT_CLINIC_OR_DEPARTMENT_OTHER): Payer: No Typology Code available for payment source

## 2019-10-23 DIAGNOSIS — M546 Pain in thoracic spine: Secondary | ICD-10-CM | POA: Diagnosis not present

## 2019-10-23 DIAGNOSIS — M549 Dorsalgia, unspecified: Secondary | ICD-10-CM

## 2019-10-23 MED ORDER — IBUPROFEN 600 MG PO TABS
600.00 mg | ORAL_TABLET | Freq: Three times a day (TID) | ORAL | 0 refills | Status: AC | PRN
Start: 2019-10-23 — End: 2019-10-28

## 2019-10-23 MED ORDER — KETOROLAC TROMETHAMINE 30 MG/ML INJ
30.0000 mg | Freq: Once | Status: AC
Start: 2019-10-23 — End: 2019-10-23
  Administered 2019-10-23: 30 mg via INTRAMUSCULAR
  Filled 2019-10-23: qty 1

## 2019-10-23 MED ORDER — METHOCARBAMOL 500 MG PO TABS
500.00 mg | ORAL_TABLET | Freq: Four times a day (QID) | ORAL | 0 refills | Status: AC
Start: 2019-10-23 — End: 2019-10-28

## 2019-10-23 MED ORDER — LIDOCAINE 5 % EX PTCH
1.00 | MEDICATED_PATCH | CUTANEOUS | 0 refills | Status: AC
Start: 2019-10-23 — End: 2019-10-28

## 2019-10-23 MED ORDER — ACETAMINOPHEN 500 MG PO TABS
1000.0000 mg | ORAL_TABLET | Freq: Once | ORAL | Status: AC
Start: 2019-10-23 — End: 2019-10-23
  Administered 2019-10-23: 1000 mg via ORAL
  Filled 2019-10-23: qty 2

## 2019-10-23 MED ORDER — LIDOCAINE 4 % EX PTCH
1.0000 | MEDICATED_PATCH | Freq: Once | CUTANEOUS | Status: DC
Start: 2019-10-23 — End: 2019-10-24
  Administered 2019-10-23: 1 via TRANSDERMAL
  Filled 2019-10-23: qty 1
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
## 2019-10-24 NOTE — Narrator Note (Signed)
Patient Disposition  Patient education for diagnosis, medications, activity, diet and follow-up.  Patient left ED 12:08 AM.  Patient rep received written instructions.    Interpreter to provide instructions: Yes; Interpreter ID: Ana    Patient belongings with patient: YES    Have all existing LDAs been addressed? N/A    Have all IV infusions been stopped? N/A    Destination: Discharged to home  Discharge instructions explained to patient including follow up care and prescriptions patient is agreeable to DC plan, provided teach back.

## 2019-10-26 ENCOUNTER — Telehealth (HOSPITAL_BASED_OUTPATIENT_CLINIC_OR_DEPARTMENT_OTHER): Payer: Self-pay

## 2019-10-26 NOTE — Telephone Encounter (Signed)
Screening for possible COVID:    1. In the past 14 days, has the patient been exposed to COVID or told to quarantine because of a COVID exposure?  No   2. In the past 14 days, has the patient had new or worsening (for chronic symptoms) fever, cough, shortness of breath, body aches, runny nose, sore throat, nausea, diarrhea, or lack of smell/taste? No   3. In the past 14 days, has the patient had COVID or suspected COVID?  No

## 2019-10-27 ENCOUNTER — Ambulatory Visit (HOSPITAL_BASED_OUTPATIENT_CLINIC_OR_DEPARTMENT_OTHER): Payer: Self-pay | Admitting: Internal Medicine

## 2019-10-28 ENCOUNTER — Telehealth (HOSPITAL_BASED_OUTPATIENT_CLINIC_OR_DEPARTMENT_OTHER): Payer: Self-pay | Admitting: Internal Medicine
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
## 2019-10-28 NOTE — Telephone Encounter (Signed)
Called Patient with Joshua Holt, There's no openings at the clinic this week.    Patient states he's not having any pain at the moment, he will    Go to the Ed/urgent care if he's having any pains.      FYI  Patient is scheduled to see Dr. Ancil Boozer 11/09/2019.    Joshua Holt December, 10/28/2019

## 2019-11-09 ENCOUNTER — Ambulatory Visit (HOSPITAL_BASED_OUTPATIENT_CLINIC_OR_DEPARTMENT_OTHER): Payer: Self-pay

## 2020-01-01 ENCOUNTER — Ambulatory Visit (HOSPITAL_BASED_OUTPATIENT_CLINIC_OR_DEPARTMENT_OTHER): Payer: Self-pay | Admitting: Ambulatory Care

## 2020-01-01 NOTE — Telephone Encounter (Signed)
Called back to pt with tel interpreter Alethia Berthold PA20  "Boss asked me to test for covid because the neighbor traved and came back with covid". Having the test in [place] now. Needs no further assistance.  Talked to her in hallway with masks on. Is vaccinated.

## 2020-01-01 NOTE — Telephone Encounter (Signed)
Regarding: Covid testing. Contact  ----- Message from Riccardo Dubin sent at 01/01/2020 11:18 AM EDT -----  Joshua Holt [phone], 49 year old, male    Calls today:  Sick    What are the symptoms : Patient states that his roommate was tested positive for Covid. States that his job is asking him to get tested  How long has patient been sick?   What has pt. tried at home   Person calling on behalf of patient: Patient (self)    CALL BACK NUMBER: [phone]  Best time to call back:   Cell phone:   Other phone:    Patient's language of care: [place] ([place]    Patient needs a [place] interpreter.    Patient's PCP: Lamont Dowdy, MD

## 2020-05-19 ENCOUNTER — Emergency Department
Admission: AD | Admit: 2020-05-19 | Discharge: 2020-05-19 | Disposition: A | Discharge status: Home / Self Care | Payer: No Typology Code available for payment source | Source: Ambulatory Visit | Attending: Student in an Organized Health Care Education/Training Program | Admitting: Student in an Organized Health Care Education/Training Program

## 2020-05-19 ENCOUNTER — Emergency Department (HOSPITAL_BASED_OUTPATIENT_CLINIC_OR_DEPARTMENT_OTHER): Payer: No Typology Code available for payment source

## 2020-05-19 ENCOUNTER — Telehealth (HOSPITAL_BASED_OUTPATIENT_CLINIC_OR_DEPARTMENT_OTHER): Payer: Self-pay | Admitting: Orthopaedic Surgery

## 2020-05-19 DIAGNOSIS — S82891A Other fracture of right lower leg, initial encounter for closed fracture: Secondary | ICD-10-CM | POA: Insufficient documentation

## 2020-05-19 DIAGNOSIS — R0781 Pleurodynia: Secondary | ICD-10-CM

## 2020-05-19 DIAGNOSIS — S82831A Other fracture of upper and lower end of right fibula, initial encounter for closed fracture: Secondary | ICD-10-CM

## 2020-05-19 DIAGNOSIS — S0012XA Contusion of left eyelid and periocular area, initial encounter: Secondary | ICD-10-CM | POA: Diagnosis present

## 2020-05-19 DIAGNOSIS — R0789 Other chest pain: Secondary | ICD-10-CM

## 2020-05-19 DIAGNOSIS — S8264XA Nondisplaced fracture of lateral malleolus of right fibula, initial encounter for closed fracture: Secondary | ICD-10-CM

## 2020-05-19 DIAGNOSIS — S298XXA Other specified injuries of thorax, initial encounter: Secondary | ICD-10-CM

## 2020-05-19 DIAGNOSIS — S20212A Contusion of left front wall of thorax, initial encounter: Secondary | ICD-10-CM | POA: Diagnosis present

## 2020-05-19 MED ORDER — IBUPROFEN 600 MG PO TABS
600.00 mg | ORAL_TABLET | Freq: Four times a day (QID) | ORAL | 0 refills | Status: AC | PRN
Start: 2020-05-19 — End: 2020-05-26

## 2020-05-19 MED ORDER — OXYCODONE HCL 5 MG PO TABS
5.00 mg | ORAL_TABLET | Freq: Four times a day (QID) | ORAL | 0 refills | Status: AC | PRN
Start: 2020-05-19 — End: 2020-05-26

## 2020-05-19 NOTE — Narrator Note (Signed)
xrays obtained. Re eval by MD. Right ankle splinted. Crutches given with instructions. Dc instructed.
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
## 2020-05-19 NOTE — Narrator Note (Signed)
Patient Disposition  Patient education for diagnosis, medications, activity, diet and follow-up.  Patient left UC 10:16 AM.  Patient rep received written instructions.    Interpreter to provide instructions: No    Patient belongings with patient: YES    Have all existing LDAs been addressed? N/A    Have all IV infusions been stopped? N/A    Destination: Home
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
## 2020-05-23 ENCOUNTER — Telehealth (HOSPITAL_BASED_OUTPATIENT_CLINIC_OR_DEPARTMENT_OTHER): Payer: Self-pay | Admitting: Orthopaedic Surgery

## 2020-05-23 NOTE — Telephone Encounter (Signed)
Message left in [place] and [place] for a call back to schedule an appointment with Dr Fabio Bering for 1/4. Ortho number left for a call back.

## 2020-05-31 ENCOUNTER — Ambulatory Visit: Payer: No Typology Code available for payment source | Attending: Orthopaedic Surgery | Admitting: Orthopaedic Surgery

## 2020-05-31 ENCOUNTER — Other Ambulatory Visit: Payer: Self-pay

## 2020-05-31 VITALS — BP 128/82 | HR 97 | Resp 18

## 2020-05-31 DIAGNOSIS — S8264XA Nondisplaced fracture of lateral malleolus of right fibula, initial encounter for closed fracture: Secondary | ICD-10-CM | POA: Diagnosis present
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
## 2020-05-31 NOTE — Patient Instructions (Addendum)
You should get a short, medium walker boot either online (Amazon.com for example) or a medical supply store.  You can bear weight as tolerated in the boot, and may remove it when you are not walking.  You can also work on ankle motion and light strengthening exercises.        Patient Education     Ankle Fracture Rehab  Following ankle fractures, a loss of ankle mobility and muscle strength and endurance can occur. The following exercises will help resolve these problems. Ask your health care provider which exercises are safe for you. Do exercises exactly as told by your health care provider and adjust them as directed. It is normal to feel mild stretching, pulling, tightness, or discomfort as you do these exercises. Stop right away if you feel sudden pain or your pain gets worse. Do not begin these exercises until told by your health care provider.  Stretching and range-of-motion exercises  These exercises warm up your muscles and joints and improve the movement and flexibility of your knee. These exercises may also help to relieve pain and swelling.  Passive ankle eversion    This is an exercise in which you use your hands to move your ankle away from you. Your ankle does not move on its own.  1. Sit with your left / right ankle crossed over your opposite knee.  2. Hold your left / right foot with your opposite hand.  3. Gently push the top of your foot downward so your big toe moves away from you and toward the floor (eversion). Keep your lower leg steady. You should feel a gentle stretch on the inside of your ankle.  4. Hold the stretch for __________ seconds.  Repeat __________ times. Complete this exercise __________ times a day.  Passive ankle inversion    This is an exercise in which you use your hands to move your ankle toward you. Your ankle does not move on its own.  1. Sit with your left / right ankle crossed over your opposite knee.  2. Hold your left / right foot with your opposite hand.  3. Gently pull  your foot upward toward you (inversion). Keep your lower leg steady. Your big toe will move toward you and toward the ceiling. You should feel a gentle stretch on the outside of your ankle.  4. Hold the stretch for __________ seconds.  Repeat __________ times. Complete this exercise __________ times a day.  Ankle alphabet    1. Sit with your left / right lower leg supported at the lower leg.  ? Do not rest your foot on anything.  ? Make sure your foot has room to move freely.  2. Think of your left / right foot as a paintbrush.  ? Move your foot to trace each letter of the alphabet in the air. Keep your hip and knee still while you trace.  ? Make the letters as large as you can without feeling discomfort.  3. Trace every letter from A to Z.  Repeat __________ times. Complete this exercise __________ times a day.  Gastrocnemius    This exercise is also called calf stretch. It stretches the muscles in the back of the lower leg.  1. Sit on the floor with your left / right leg straight.  2. Loop a belt or towel around the ball of your left / right foot.  3. Keep your left / right ankle and foot relaxed and keep your knee straight while you  use the belt or towel to pull your foot and ankle toward you. You should feel a gentle stretch behind your ankle or in your calf.  4. Hold this position for __________ seconds.  Repeat __________ times. Complete this exercise __________ times a day.  Strengthening exercises  These exercises build strength and endurance in your foot and ankle. Endurance is the ability to use your muscles for a long time, even after they get tired.  Towel curls    1. Sit in a chair on a non-carpeted surface, and put your feet on the floor.  2. Place a towel in front of your feet. If told by your health care provider, add __________ to the end of the towel.  3. Keeping your heel on the floor, put your left / right foot on the towel.  4. Pull the towel toward you by grabbing the towel with your toes and  curling them under. Keep your heel on the floor.  5. Let your toes relax.  6. Grab the towel with your toes again. Keep going until the towel is completely underneath your foot.  Repeat __________ times. Complete this exercise __________ times a day.  This information is not intended to replace advice given to you by your health care provider. Make sure you discuss any questions you have with your health care provider.  Document Revised: 08/26/2018 Document Reviewed: 02/04/2018  Elsevier Patient Education  2021 [employer].

## 2020-07-01 ENCOUNTER — Ambulatory Visit (HOSPITAL_BASED_OUTPATIENT_CLINIC_OR_DEPARTMENT_OTHER): Payer: No Typology Code available for payment source | Admitting: Orthopaedic Surgery

## 2020-07-08 ENCOUNTER — Encounter (HOSPITAL_BASED_OUTPATIENT_CLINIC_OR_DEPARTMENT_OTHER): Payer: Self-pay

## 2020-07-08 ENCOUNTER — Emergency Department (HOSPITAL_BASED_OUTPATIENT_CLINIC_OR_DEPARTMENT_OTHER): Payer: No Typology Code available for payment source

## 2020-07-08 ENCOUNTER — Emergency Department
Admission: AD | Admit: 2020-07-08 | Discharge: 2020-07-08 | Disposition: A | Discharge status: Home / Self Care | Payer: No Typology Code available for payment source | Source: Ambulatory Visit | Attending: Vascular & Interventional Radiology | Admitting: Vascular & Interventional Radiology

## 2020-07-08 DIAGNOSIS — S2242XD Multiple fractures of ribs, left side, subsequent encounter for fracture with routine healing: Secondary | ICD-10-CM | POA: Insufficient documentation

## 2020-07-08 DIAGNOSIS — S2232XD Fracture of one rib, left side, subsequent encounter for fracture with routine healing: Secondary | ICD-10-CM | POA: Diagnosis present

## 2020-07-08 DIAGNOSIS — S2232XA Fracture of one rib, left side, initial encounter for closed fracture: Secondary | ICD-10-CM | POA: Insufficient documentation

## 2020-07-08 DIAGNOSIS — R519 Headache, unspecified: Secondary | ICD-10-CM

## 2020-07-08 DIAGNOSIS — R0781 Pleurodynia: Secondary | ICD-10-CM | POA: Insufficient documentation

## 2020-07-08 MED ORDER — IBUPROFEN 600 MG PO TABS
600.0000 mg | ORAL_TABLET | Freq: Three times a day (TID) | ORAL | 0 refills | Status: DC | PRN
Start: 2020-07-08 — End: 2020-07-14

## 2020-07-08 MED ORDER — KETOROLAC TROMETHAMINE 30 MG/ML INJ
30.0000 mg | Freq: Once | Status: AC
Start: 2020-07-08 — End: 2020-07-08
  Administered 2020-07-08: 30 mg via INTRAMUSCULAR
  Filled 2020-07-08: qty 1

## 2020-07-08 NOTE — Narrator Note (Signed)
Re eval by MD. Dc instructed
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
## 2020-07-08 NOTE — Narrator Note (Signed)
Patient Disposition  Patient education for diagnosis, medications, activity, diet and follow-up.  Patient left UC 12:19 PM.  Patient rep received written instructions.    Interpreter to provide instructions: No    Patient belongings with patient: YES    Have all existing LDAs been addressed? N/A    Have all IV infusions been stopped? N/A    Destination: Home
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
## 2020-07-14 ENCOUNTER — Encounter (HOSPITAL_BASED_OUTPATIENT_CLINIC_OR_DEPARTMENT_OTHER): Payer: Self-pay | Admitting: Family

## 2020-07-14 ENCOUNTER — Other Ambulatory Visit: Payer: Self-pay

## 2020-07-14 ENCOUNTER — Ambulatory Visit: Payer: No Typology Code available for payment source | Attending: Family Medicine | Admitting: Family

## 2020-07-14 ENCOUNTER — Telehealth (HOSPITAL_BASED_OUTPATIENT_CLINIC_OR_DEPARTMENT_OTHER): Payer: Self-pay | Admitting: Internal Medicine

## 2020-07-14 VITALS — BP 117/69 | HR 84 | Temp 98.0°F | Wt 134.0 lb

## 2020-07-14 DIAGNOSIS — Z13228 Encounter for screening for other metabolic disorders: Secondary | ICD-10-CM | POA: Insufficient documentation

## 2020-07-14 DIAGNOSIS — Z113 Encounter for screening for infections with a predominantly sexual mode of transmission: Secondary | ICD-10-CM | POA: Diagnosis present

## 2020-07-14 DIAGNOSIS — F1010 Alcohol abuse, uncomplicated: Secondary | ICD-10-CM | POA: Diagnosis present

## 2020-07-14 DIAGNOSIS — Z23 Encounter for immunization: Secondary | ICD-10-CM | POA: Insufficient documentation

## 2020-07-14 DIAGNOSIS — Z1329 Encounter for screening for other suspected endocrine disorder: Secondary | ICD-10-CM | POA: Diagnosis not present

## 2020-07-14 DIAGNOSIS — Z1321 Encounter for screening for nutritional disorder: Secondary | ICD-10-CM | POA: Insufficient documentation

## 2020-07-14 DIAGNOSIS — Z0000 Encounter for general adult medical examination without abnormal findings: Secondary | ICD-10-CM | POA: Diagnosis present

## 2020-07-14 DIAGNOSIS — Z130 Encounter for screening for diseases of the blood and blood-forming organs and certain disorders involving the immune mechanism: Secondary | ICD-10-CM | POA: Diagnosis present

## 2020-07-14 MED ORDER — IBUPROFEN 600 MG PO TABS
600.00 mg | ORAL_TABLET | Freq: Three times a day (TID) | ORAL | 0 refills | Status: AC | PRN
Start: 2020-07-14 — End: 2020-07-24

## 2020-07-14 MED FILL — IBUPROFEN 600MG: 10 days supply | Qty: 30 | Fill #0

## 2020-07-14 NOTE — Telephone Encounter (Signed)
tdap    The vaccine is covered under the patient's [employer] medical coverage.    Please choose Private

## 2020-07-14 NOTE — Progress Notes (Signed)
Most Recent Weight Reading(s)  07/14/20 : 60.8 kg (134 lb)  07/08/20 : 59 kg (130 lb 1.1 oz)  05/19/20 : 58 kg (127 lb 13.9 oz)  07/01/14 : 62.6 kg (138 lb)  03/03/14 : 59 kg (130 lb)        SUBJECTIVE:   Joshua Holt 50 year old male presenting for his annual checkup.    He is here for ER follow up.  Has not been seen for many years by primary care.    -He states he has received 2 Covid vaccines.  Does not want to receive the third.  -He is open to a flu shot  -He needs a tetanus shot  -Continues to have some rib pain but is slowly getting better.  Never filled the ibuprofen.  -He states he only drinks on weekends.  He does admit that he drinks a lot when he does drink.  Often has 10 or more beers.  He states he does not drink and drive.    He denies withdrawal symptoms      Current Outpatient Medications   Medication Sig    ibuprofen (ADVIL) 600 MG tablet Take 1 tablet by mouth every 8 (eight) hours as needed for Pain pain  for up to 10 days     No current facility-administered medications for this visit.     Allergies: Patient has no known allergies.     ROS:  Feeling well. No dyspnea or chest pain on exertion.  No neurological complaints.  No abdominal pain, change in bowel habits, black or bloody stools.  No urinary tract or prostatic symptoms.     OBJECTIVE:   The patient appears well, in NAD.   BP 117/69 (Site: LA, Position: Sitting, Cuff Size: Reg)    Pulse 84    Temp 98 F (36.7 C) (Temporal)    Wt 60.8 kg (134 lb)    SpO2 100%    BMI 20.08 kg/m   ENT normal.  Neck supple. No adenopathy or thyromegaly. PERLA. Lungs are clear, good air entry, no wheezes, rhonci or rales. S1 and S2 normal, no murmurs, regular rate and rhythm. No edema. Abdomen soft without tenderness, guarding, mass or organomegaly.       (Z00.00) Physical exam  (primary encounter diagnosis)  Comment:   Plan: The pt today was counseled on age appropriate health behaviors incl dental care, not driving while distracted,  vitamin D, exercise, bike helmets, sunscreen use, smoke detectors and healthy eating habits        (Z23) Need for Tdap vaccination  Comment:   Plan: TDAP VACCINE 7 AND OLDER IM, IMMUNIZATION ADMIN        SINGLE            (Z23) Need for prophylactic vaccination and inoculation against influenza  Comment:   Plan: IIV4 VACC PRESERV FREE AGE 16 MONTHS AND OLDER,         0.5ML, IM, IMMUNIZATION ADMIN EACH ADD            (F10.10) Alcohol abuse  Comment:   Plan: Reviewed concerns around his drinking.  Discussed that this is heavy.  Asked if he could consider not drinking for a few days.  He will consider this but does plan to drink this weekend.  -Encouraged to consider that he is drinking too much.  Reviewed that we do have assistance to offer.  He was grateful.      (Z13.29,  Z13.21,  Z13.228,  Z13.0) Screening for  endocrine, nutritional, metabolic and immunity disorder  Comment:   Plan: LIPID PANEL, COMPREHENSIVE METABOLIC PANEL,         HEMOGLOBIN A1C, COLLECTION VENOUS BLOOD         VENIPUNCTURE, RPR            (Z11.3) Screen for STD (sexually transmitted disease)  Comment:   Plan: HEPATITIS B SURFACE AB QUANT, HEPATITIS B         SURFACE ANTIGEN, HEPATITIS C ANTIBODY, HIV         ANTIGEN ANTIBODY 5TH GEN            He will follow-up as needed

## 2020-07-15 LAB — HIV ANTIGEN ANTIBODY 5TH GEN: HIVAGAB QUALITATIVE: NONREACTIVE

## 2020-07-15 LAB — HEMOGLOBIN A1C
ESTIMATED AVERAGE GLUCOSE: 94 mg/dL (ref 74–160)
HEMOGLOBIN A1C: 4.9 % (ref 4.0–5.6)

## 2020-07-15 LAB — RPR: RPR QUAL: NONREACTIVE

## 2020-07-18 ENCOUNTER — Encounter (HOSPITAL_BASED_OUTPATIENT_CLINIC_OR_DEPARTMENT_OTHER): Payer: Self-pay | Admitting: Family

## 2020-07-23 LAB — COMPREHENSIVE METABOLIC PANEL
ALANINE AMINOTRANSFERASE: 24 U/L (ref 12–45)
ALBUMIN: 4.3 g/dL (ref 3.4–5.0)
ALKALINE PHOSPHATASE: 72 U/L (ref 45–117)
ANION GAP: 4 mmol/L — ABNORMAL LOW (ref 5–15)
ASPARTATE AMINOTRANSFERASE: 10 U/L (ref 8–34)
BILIRUBIN TOTAL: 0.4 mg/dL (ref 0.2–1.0)
BUN (UREA NITROGEN): 19 mg/dL — ABNORMAL HIGH (ref 7–18)
CALCIUM: 9.7 mg/dL (ref 8.5–10.1)
CARBON DIOXIDE: 32 mmol/L (ref 21–32)
CHLORIDE: 102 mmol/L (ref 98–107)
CREATININE: 0.9 mg/dL (ref 0.7–1.2)
ESTIMATED GLOMERULAR FILT RATE: >60 mL/min (ref >60)
Glucose Random: 73 mg/dL — ABNORMAL LOW (ref 74–160)
POTASSIUM: 4.5 mmol/L (ref 3.5–5.1)
SODIUM: 138 mmol/L (ref 136–145)
TOTAL PROTEIN: 7.3 g/dL (ref 6.4–8.2)

## 2020-07-23 LAB — LIPID PANEL
Cholesterol: 215 mg/dL (ref 0–239)
HIGH DENSITY LIPOPROTEIN: 91 mg/dL (ref 40–?)
LOW DENSITY LIPOPROTEIN DIRECT: 123 mg/dL (ref 0–189)
TRIGLYCERIDES: 42 mg/dL (ref 0–150)

## 2020-07-23 LAB — HEPATITIS C ANTIBODY: HEPATITIS C ANTIBODY: NONREACTIVE

## 2020-07-23 LAB — HEPATITIS B SURFACE AB QUANT: HEPATITIS B SURFACE AB QUANT: 601 m[IU]/mL (ref 0.0–8.4)

## 2020-07-23 LAB — HEPATITIS B SURFACE ANTIGEN: HEPATITIS B SURFACE ANTIGEN: NONREACTIVE

## 2020-08-02 ENCOUNTER — Ambulatory Visit (HOSPITAL_BASED_OUTPATIENT_CLINIC_OR_DEPARTMENT_OTHER): Payer: No Typology Code available for payment source | Admitting: Family

## 2020-10-25 ENCOUNTER — Ambulatory Visit: Payer: No Typology Code available for payment source | Attending: Family Medicine | Admitting: Family Medicine

## 2020-10-25 ENCOUNTER — Other Ambulatory Visit: Payer: Self-pay

## 2020-10-25 VITALS — BP 110/50 | HR 73 | Temp 98.2°F | Ht 68.5 in | Wt 137.8 lb

## 2020-10-25 DIAGNOSIS — Z23 Encounter for immunization: Secondary | ICD-10-CM | POA: Insufficient documentation

## 2020-10-25 DIAGNOSIS — K644 Residual hemorrhoidal skin tags: Secondary | ICD-10-CM | POA: Insufficient documentation

## 2020-10-25 NOTE — Patient Instructions (Signed)
The lump you feel is a hemorrhoid.  If it gets very painful, swollen or itchy, you can use cream from the pharmacy called "Preparation H."  You can also use wipes that have witch hazel in them, called "Tucks."

## 2020-10-25 NOTE — Progress Notes (Signed)
Subjective:  Joshua Holt is a 50 year old male.  He speaks English effectively and declines the need for [place] interpreter.    Patient's chief complaint: lump on anus    - felt a discomfort on his anus 2 weeks ago  - last week, he noticed a small lump in the area  - he continues to feel the one lump there  - says it almost looks like a vein  - it's painful when he touches it and it's a little bit itchy  - not painful when he poops  - no blood in the poop  - doesn't poop every day, but does not have to strain; BMs are "very smooth"  - hasn't used any topical creams    - agreeable to COVID booster shot today    Patient's medical history was reviewed.    Allergies were reviewed.    Objective:   10/25/20  1025   BP: 110/50   Site: Left Arm   Position: Sitting   Cuff Size: Regular   Pulse: 73   Temp: 98.2 F (36.8 C)   TempSrc: Temporal   SpO2: 97%   Weight: 62.5 kg (137 lb 12.8 oz)   Height: 5' 8.5" (1.74 m)     General: very pleasant, well-appearing male, in no acute distress  Rectal: one non-inflamed, non-thrombosed external hemorrhoid, mildly tender to palpation; no anal fissures; no frank blood noted    Assessment/Plan:  1. External hemorrhoid  - exam consistent with non-thrombosed external hemorrhoid  - reviewed supportive care measures, i.e. Preparation H and/or witch hazel wipes if the hemorrhoid becomes larger or more painful, drinking plenty of water and eating a fiber-rich diet to keep BMs soft and regular to avoid straining, etc.    2. Need for COVID-19 vaccine  - third dose of Pfizer COVID-19 vaccine administered today with patient's consent    - PFIZER COVID-19 VACCINE (GRAY CAP)       The patient indicates understanding of these issues and agrees with the plan.

## 2021-07-12 ENCOUNTER — Encounter (HOSPITAL_BASED_OUTPATIENT_CLINIC_OR_DEPARTMENT_OTHER): Payer: Self-pay

## 2021-07-12 ENCOUNTER — Emergency Department
Admission: AD | Admit: 2021-07-12 | Discharge: 2021-07-12 | Disposition: A | Discharge status: Home / Self Care | Payer: No Typology Code available for payment source | Source: Ambulatory Visit | Attending: Emergency Medicine | Admitting: Emergency Medicine

## 2021-07-12 DIAGNOSIS — R253 Fasciculation: Secondary | ICD-10-CM | POA: Diagnosis present

## 2021-07-12 NOTE — Narrator Note (Signed)
Pt was advised by the provider and discharged.

## 2021-07-12 NOTE — UC Triage Notes (Signed)
Pt has involuntary twitching of a muscle in his right upper arm x 3 days.  Pt denies trauma.  He denies pain.

## 2021-07-12 NOTE — UC Provider Notes (Signed)
The patient was seen primarily by me. UC nursing record was reviewed. Select prior records as available electronically through the Epic record were reviewed.    HPI:    Joshua Holt is a 51 year old male patient who has a past medical history of Seizure ([place]). The pt presents to the emergency department complaining of twitching in the musculature of his right shoulder/upper bicep.  He has history of what he describes as partial seizures back in [place] and is worried that this could be indicative of an impending seizure.  He is not having any pain, headache, nausea, vomiting blurred vision.  He otherwise feels fine.    Family Hx: Noncontributory for Musculoskeletal Problem    ROS: Pertinent positives were reviewed as per the HPI above. All other systems were reviewed and are negative.  Nadara Mustard  Language of care: [place] Derwood Kaplan)  MRN: [id number]  PCP: Manya Silvas, APRN  Mode of arrival to Urgent Care: Walk-in.  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  Patient Active Problem List:     Alcohol abuse     Dyspepsia and other specified disorders of function of stomach     Unspecified hemorrhoids without mention of complication     History of convulsions     Ocular migraine    Past Surgical History: Past Surgical History:  No date: NO SIGNIFICANT SURGICAL HISTORY  Social History:   Social History     Socioeconomic History    Marital status: Single     Spouse name: Not on file    Number of children: Not on file    Years of education: Not on file    Highest education level: Not on file   Occupational History    Not on file   Tobacco Use    Smoking status: Never    Smokeless tobacco: Never   Substance and Sexual Activity    Alcohol use: Yes     Alcohol/week: 8.3 standard drinks     Types: 7 Cans of beer, 3 Standard drinks  or equivalent per week     Comment: Drank 10-20 drinks/day (beer) during weekends for 5 years; Cut back recently due to stomach pain    Drug use: No    Sexual activity: Not Currently     Partners: Female     Comment: last partner 3 months ago; no condom use; STD testing 2 months   Other Topics Concern    Not on file   Social History Narrative    Born in [place]; moved to the [place] in 2006 for financial reasons.    First moved to [place] to [place].  Moved to the [place] area in 2009.        No family in [place]; remain in [place].    Currently working as a Curator        Lives with friends and his brother        2 children in [place]; his wife has 2 more children. All 4 live in [place]        Describes difficulty living so far away from family. He misses his family greatly, and his relationship with his wife has changed a lot. They have stayed in touch for the  children, but he has not seen her in 5 years.        Currently living with his brothers in [place]        04/2014 - still working as a Curator; living with brother and other roommates.        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  HEAD:  NCAT. Sclerae are anicteric and aninjected, oropharynx is clear with moist mucous membranes. PERRL.  NECK:  Supple, no LAN.  LUNGS:  Clear to auscultation bilaterally. No wheezes, rales, rhonchi.   HEART:  RRR.  No murmurs, rubs, or gallops.   ABDOMEN:  Soft, NTND.  EXTREMITIES:  No obvious deformities.    NEUROLOGIC:  Alert; moves all extremities; speaking in clear fluent sentences. Normal gait without ataxia. 2+ patellar deep tendon reflexes bilaterally.    Medications Given in Urgent Care:    Medications - No data to display Radiology Results:  See UC COURSE   Lab Results:     Labs Reviewed - No data to display     XR Ribs Left with PA Chest  Exam: Left Ribs, 4 views, and 1 view chest    Indication: pain, injury    Comparison: May 19, 2020    Findings:    Bones: There are healing fractures of at least 3 left ribs, probably 8, 9, and 10. I do not see an acute fracture    Lungs/ Pleura: There is no pneumothorax. There is no consolidation.     Heart: The heart is normal in size.    Mediastinum/hila: Unremarkable    Impression:    1. Healing left rib fractures. Even in retrospect, they are not visible on the prior exam. No convincing acute fracture  2. No pneumothorax.          Reviewed and Electronically Signed by: Signe Colt MD   Signed Date/Time: 07-08-2020 11:51:07             Other Results and OLD/PRIOR records information and data (e.g. ECG, visual acuity):  See UC COURSE   Urgent Care Medical Decision-making:     The patient is healthy, no acute distress and well-appearing.  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  He should have routine blood work and screening labs performed by his PCP.  He may benefit from a nonemergent CT or  MRI of his head.    Patient/family educated on diagnosis(es); he states understanding and agreement with plan of care. Reasons to present to the nearest ED were reviewed in detail. He agrees with this plan and disposition.    Disposition: Discharge    Condition on Discharge: Improved and Stable    Diagnosis/Diagnoses:  Benign fasciculations    Clydell Hakim DO MPH, Department of Emergency Medicine, [facility]  This urgent care patient encounter note was created during a national COVID-19 pandemic using voice-recognition software and in real time while caring for patients.

## 2021-07-12 NOTE — Narrator Note (Signed)
Patient Disposition  Patient education for diagnosis and follow-up.  Patient left UC 11:20 AM.  Patient  received written instructions.    Interpreter to provide instructions: No    Patient belongings with patient: YES    Have all existing LDAs been addressed? N/A    Have all IV infusions been stopped? N/A    Destination: Home

## 2021-07-12 NOTE — Discharge Instructions (Addendum)
Please call the doctor finder number at [phone] or contact [place] to set up a follow-up appointment for screening blood work and imaging.

## 2021-07-12 NOTE — Narrator Note (Signed)
Pt was examined by Dr. Leary.

## 2021-09-03 ENCOUNTER — Emergency Department (HOSPITAL_BASED_OUTPATIENT_CLINIC_OR_DEPARTMENT_OTHER): Payer: No Typology Code available for payment source

## 2021-09-03 ENCOUNTER — Emergency Department
Admission: EM | Admit: 2021-09-03 | Discharge: 2021-09-03 | Disposition: A | Discharge status: Home / Self Care | Payer: No Typology Code available for payment source | Attending: Emergency Medicine | Admitting: Emergency Medicine

## 2021-09-03 DIAGNOSIS — R519 Headache, unspecified: Secondary | ICD-10-CM | POA: Diagnosis not present

## 2021-09-03 DIAGNOSIS — S0993XA Unspecified injury of face, initial encounter: Secondary | ICD-10-CM | POA: Diagnosis not present

## 2021-09-03 DIAGNOSIS — S0990XA Unspecified injury of head, initial encounter: Secondary | ICD-10-CM | POA: Diagnosis not present

## 2021-09-03 DIAGNOSIS — S6991XA Unspecified injury of right wrist, hand and finger(s), initial encounter: Secondary | ICD-10-CM | POA: Insufficient documentation

## 2021-09-03 DIAGNOSIS — W19XXXA Unspecified fall, initial encounter: Secondary | ICD-10-CM | POA: Diagnosis not present

## 2021-09-03 DIAGNOSIS — M79644 Pain in right finger(s): Secondary | ICD-10-CM | POA: Diagnosis not present

## 2021-09-03 MED ORDER — ACETAMINOPHEN 500 MG PO TABS
1000.0000 mg | ORAL_TABLET | Freq: Once | ORAL | Status: AC
Start: 2021-09-03 — End: 2021-09-03
  Administered 2021-09-03: 1000 mg via ORAL
  Filled 2021-09-03: qty 2

## 2021-09-03 MED ORDER — ACETAMINOPHEN 325 MG PO TABS
650.00 mg | ORAL_TABLET | Freq: Four times a day (QID) | ORAL | 0 refills | Status: AC | PRN
Start: 2021-09-03 — End: 2021-09-06
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
## 2021-09-03 NOTE — Narrator Note (Signed)
Patient Disposition  Patient education for diagnosis, medications, activity, diet and follow-up.  Patient left ED 9:11 AM.  Patient rep received written instructions.    Interpreter to provide instructions: Yes; Interpreter ID: [id number]    Patient belongings with patient: YES    Have all existing LDAs been addressed? N/A    Have all IV infusions been stopped? N/A    Destination: Discharged to in police custody, medications, d/c instructions and appropriate f/u's reviewed. Pt aware when to return to ED if needed. Pt verbalizes understanding and agrees with POC.
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
## 2021-09-03 NOTE — Discharge Instructions (Addendum)
Sua tomografia computadorizada da cabe?a e do rosto n?o mostrou nenhum osso quebrado ou outros problemas de emerg?ncia. O raio-x da sua m?o tamb?m est? bom.    Se voc? n?o melhorar nos pr?ximos dias, fa?a acompanhamento com seu m?dico. Voc? tamb?m pode fazer acompanhamento com ortopedia se sua m?o continuar doendo. O n?mero est? inclu?do.    V? para uma SALA DE EMERG?NCIA imediatamente para: Dor intensa, v?mito ou qualquer [place] preocupa??o.    Your CT scan of the head and the face did not show any broken bones or other emergency problems.  The x-ray of your hand is okay as well.    If you are not improving over the next few days, please do follow-up with your doctor.  You can also follow-up with orthopedics if your hand continues to hurt.  The number is included.    Go to an EMERGENCY ROOM right away for: Severe pain, vomiting, or for any other concerns.

## 2024-03-31 IMAGING — MR CRANIO+CERVICAL
4 of 18 series · 6 of 48 positions shown · IV contrast (gadolinium)
Comparison: none

------------- REPORT [id number] -------------
METHODOLOGY:
Examination performed with SE (spin-echo), FSE (fast spin-eco), GR (gradient-echo) and FSE-IR (FLAIR) sequences, in planes of
The study was supplemented with the use of advanced Artificial Intelligence "Recon-DL" software.
multiple slices, before and after intravenous administration of the paramagnetic contrast agent.
ANALYSIS:
There is no evidence of an expansive intracranial process, acute intraparenchymal hemorrhage, acute/subacute ischemia, or extra-axial fluid collections above or below the tentorium.
Discrete hyperintense foci on T2/FLAIR weighting that do not change with contrast in the periventricular white matter and in the semioval center in both cerebral hemispheres suggestive of foci of gliosis.
BRAIN MAGNETIC RESONANCE IMAGING
The ventricular system is of normal topography, morphology and dimensions.
There are no areas with abnormal gadolinium enhancement.
No areas with restricted water diffusion are identified in the ECHO-PLANAR sequence.
Habitual flow in the large arteries of the vertebrobasilar and carotid systems, according to the Spin-Echo criterion.
Paranasal air cavities with normal signal intensity.

[Series 4: T2 · axial · 4.5mm · 0.49mm/px · z∈[-33,+107]mm · 2 of 30 slices shown (1 of 4)]
[im 1/30]
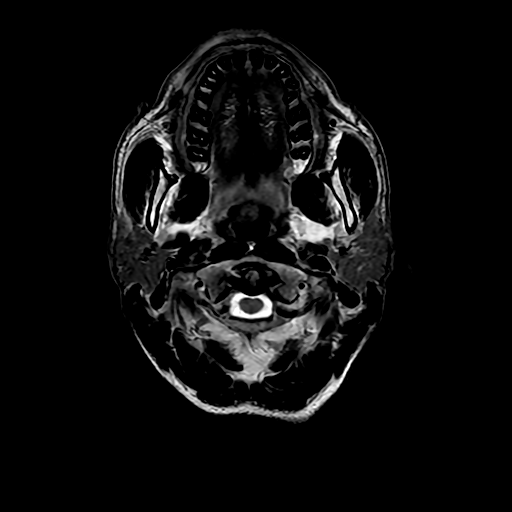
[im 30/30]
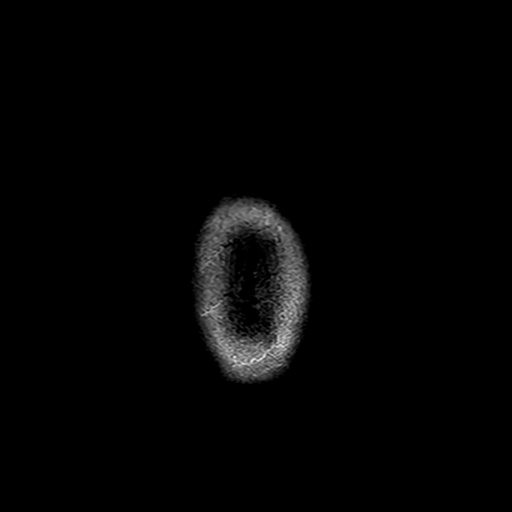

[Series 7: T2 · coronal · 3.0mm · 0.16mm/px · 1 of 20 slices shown (2 of 4)]
[im 1/20]
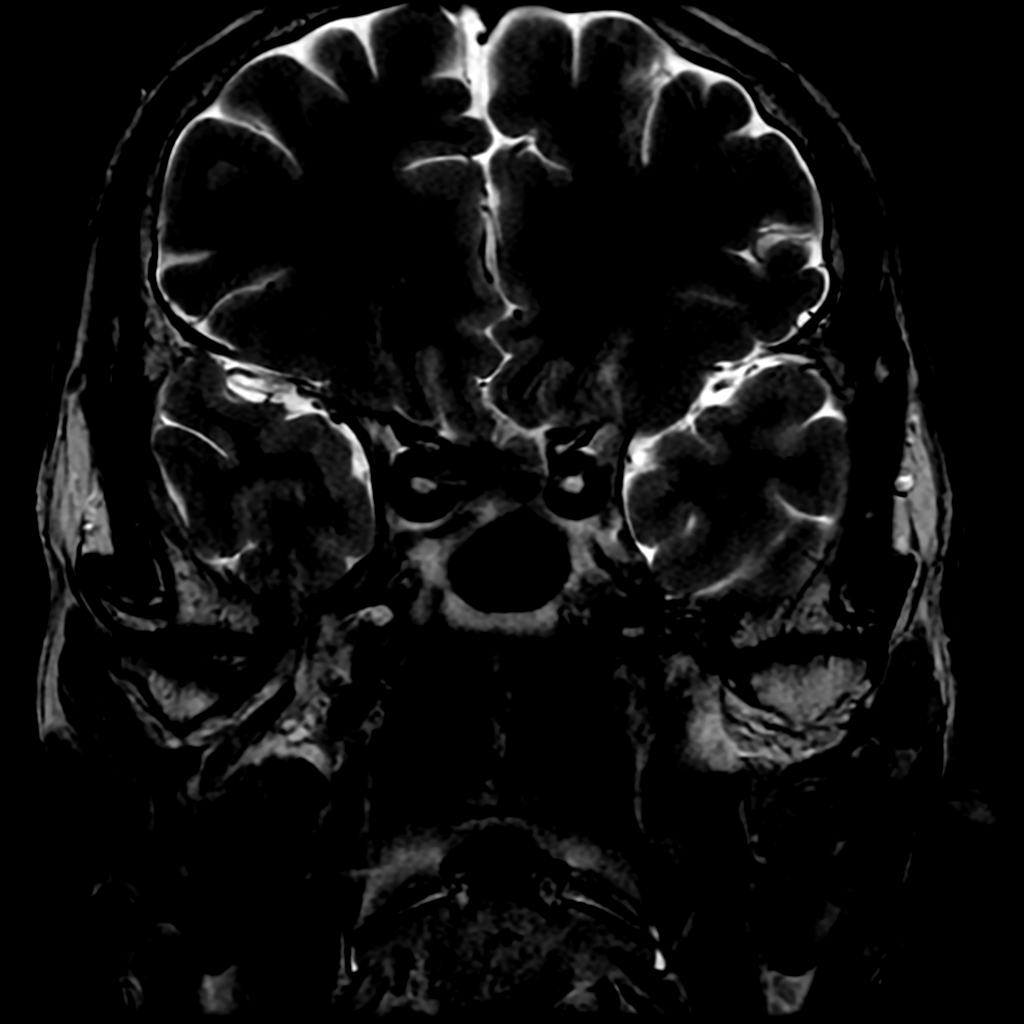

[Series 8: T2 · sagittal · 3.0mm · 0.22mm/px · 1 of 12 slices shown (3 of 4)]
[im 1/12]
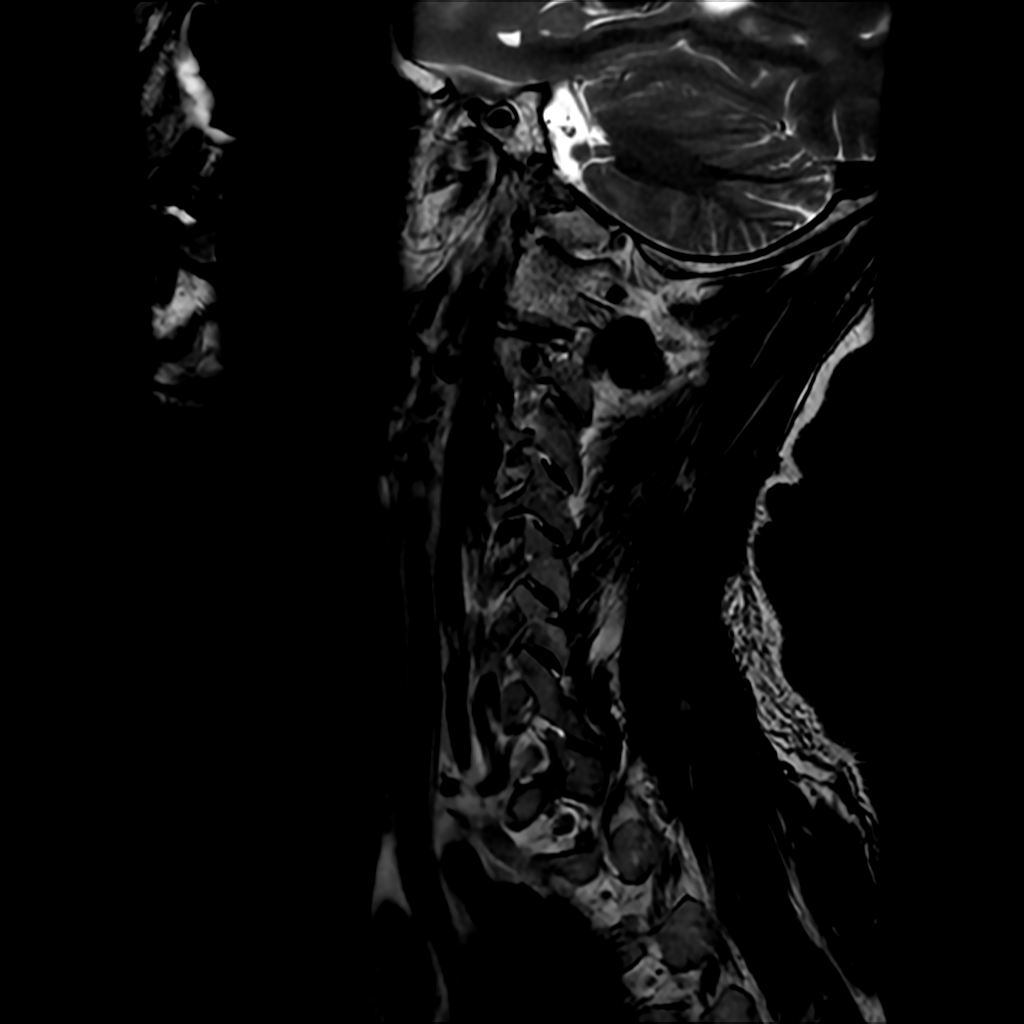

[Series 12: T2 · axial · 3.0mm · 0.17mm/px · z∈[-183,-87]mm · 2 of 30 slices shown (4 of 4)]
[im 1/30]
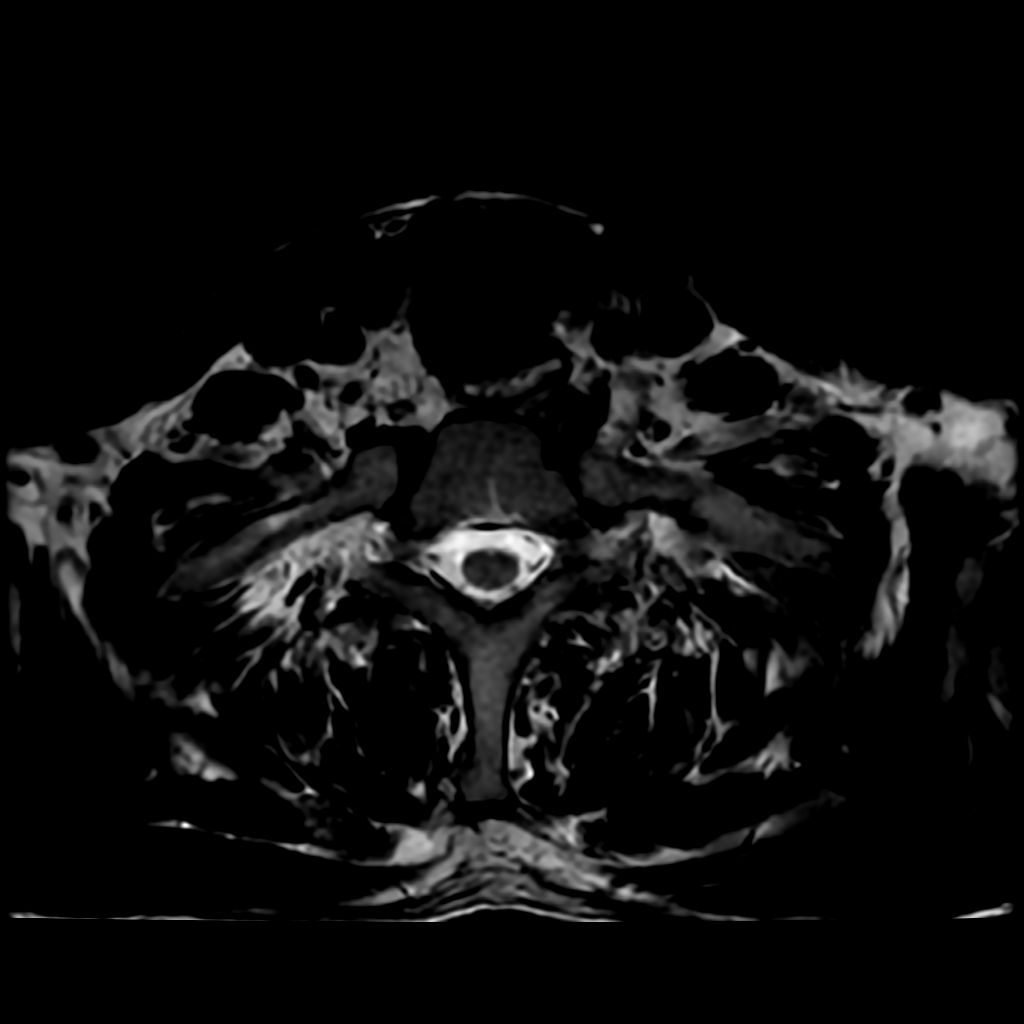
[im 30/30]
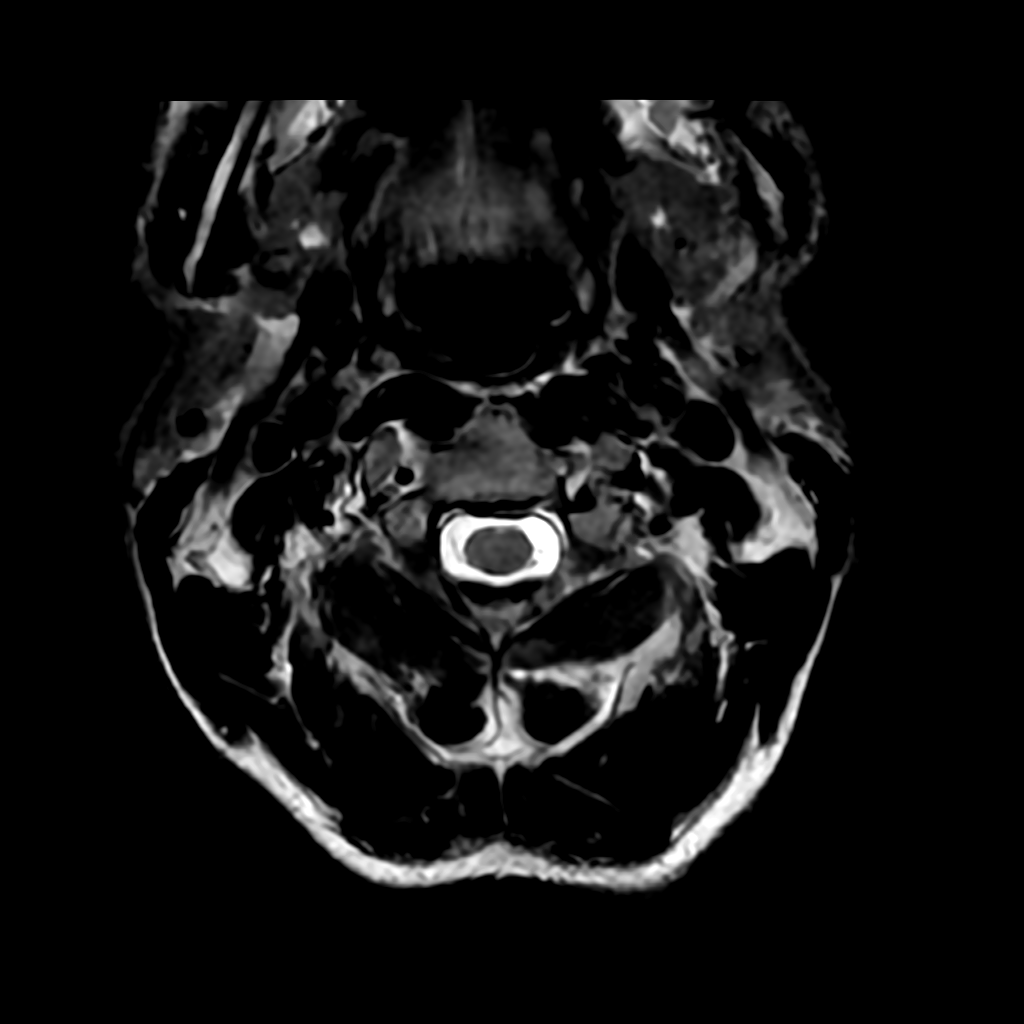

[6 of 48 positions shown; findings below may reference images not displayed]

IMPRESSION: Magnetic resonance imaging evaluation of the brain revealing:
Discrete hyperintense foci on T2/FLAIR weighting that do not change with contrast in the periventricular white matter and in the semioval center in both cerebral hemispheres suggestive of foci of gliosis.


------------- REPORT [id number] -------------
EXAMINATION TECHNIQUE:
Magnetic resonance images were obtained with FSE technique, T1 and T2 weighting with contrast.
The study was complemented by the use of "Recon-DL" advanced Artificial Intelligence software.
THE FOLLOWING ASPECTS WERE OBSERVED:
Anatomical cranio-cervical transition.
The vertebral bodies have preserved height and alignment.
MRI OF THE CERVICAL SPINE
Cervical marginal osteophyte formation.
Diffuse disc dehydration.
At C3-C4, C4-C5, C5-C6: Diffuse disc-osteophyte complexes that compress the ventral aspect of the dural sac and the spinal cord with insinuation into the foraminal bases, determining stenosis of the intervertebral foramina. It maintains proximity with emerging roots bilaterally and is associated with facet/ligament hypertrophy, reducing the anteroposterior axis of the vertebral canal, which measures 07 mm.
Slight signal alteration on T2/STIR weighting that is not modified by contrast in the cervical medullary segment C5-C6, nonspecific, and may represent an area of spondylotic myelopathy or bone marrow edema due to previous trauma.
Remaining spinal cord with preserved caliber and signal.
Remaining intervertebral foramina free.
Paravertebral musculature without significant abnormalities.
DIAGNOSTIC IMPRESSION:
Magnetic resonance evaluation of the cervical spine revealing:
Cervical marginal osteophyte formation.
Diffuse disc dehydration.
At C3-C4, C4-C5, C5-C6: Diffuse disc-osteophyte complexes that compress the ventral aspect of the dural sac and the spinal cord with insinuation into the foraminal bases, determining stenosis of the intervertebral foramina. It maintains proximity with emerging roots bilaterally and is associated with facet/ligament hypertrophy, reducing the anteroposterior axis of the vertebral canal, which measures 07 mm.
The study was complemented by the use of "Recon-DL" advanced Artificial Intelligence software.
Slight signal alteration on T2/STIR weighting that is not modified by contrast in the cervical medullary segment C5-C6, nonspecific, and may represent an area of spondylotic myelopathy or bone marrow edema due to previous trauma.
